# Patient Record
Sex: Female | Born: 1995 | Race: Black or African American | Hispanic: No | Marital: Single | Smoking: Never smoker
Health system: Southern US, Community
[De-identification: ages and names within clinical notes are randomized; demographics above are authoritative.]

---
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
## 2018-01-13 ENCOUNTER — Ambulatory Visit (HOSPITAL_COMMUNITY)
Admission: EM | Admit: 2018-01-13 | Discharge: 2018-01-13 | Disposition: A | Discharge status: Home / Self Care | Payer: Self-pay | Attending: Family Medicine | Admitting: Family Medicine

## 2018-01-13 ENCOUNTER — Other Ambulatory Visit: Payer: Self-pay

## 2018-01-13 ENCOUNTER — Encounter (HOSPITAL_COMMUNITY): Payer: Self-pay

## 2018-01-13 DIAGNOSIS — K648 Other hemorrhoids: Secondary | ICD-10-CM

## 2018-01-13 MED ORDER — HYDROCORTISONE 1 % EX CREA: TOPICAL_CREAM | CUTANEOUS | 0 refills | Status: DC

## 2018-01-13 NOTE — ED Provider Notes (Signed)
[facility] CARE CENTER   [id number] 01/13/18 Arrival Time: 1301  SUBJECTIVE:  Sara Bradford is a 22 y.o. female who presents with complaint of rectal pain that began gradually 1 week ago.  Reports an episode of straining prior to symptoms.  Localizes pain to rectum.  Describes pain as intermittent throbbing and itching in character.  Has tried OTC medications creams and wipes without relief.  Denies alleviating or aggravating factors.  Denies similar symptoms in the past.  Last BM yesterday. Blood in commode and with wiping, but states she is currently menstruating.    Denies fever, chills, appetite changes, weight changes, nausea, vomiting, chest pain, SOB, diarrhea, constipation, hematochezia, melena, dysuria, difficulty urinating, increased frequency or urgency, flank pain, loss of bowel or bladder function.  Patient's last menstrual period was 01/11/2018.  ROS: As per HPI.  History reviewed. No pertinent past medical history. History reviewed. No pertinent surgical history. No Known Allergies No current facility-administered medications on file prior to encounter.    No current outpatient medications on file prior to encounter.   Social History   Socioeconomic History  . Marital status: Single    Spouse name: Not on file  . Number of children: Not on file  . Years of education: Not on file  . Highest education level: Not on file  Occupational History  . Not on file  Social Needs  . Financial resource strain: Not on file  . Food insecurity:    Worry: Not on file    Inability: Not on file  . Transportation needs:    Medical: Not on file    Non-medical: Not on file  Tobacco Use  . Smoking status: Not on file  Substance and Sexual Activity  . Alcohol use: Not on file  . Drug use: Not on file  . Sexual activity: Not on file  Lifestyle  . Physical activity:    Days per week: Not on file    Minutes per session: Not on file  . Stress: Not on file  Relationships  .  Social connections:    Talks on phone: Not on file    Gets together: Not on file    Attends religious service: Not on file    Active member of club or organization: Not on file    Attends meetings of clubs or organizations: Not on file    Relationship status: Not on file  . Intimate partner violence:    Fear of current or ex partner: Not on file    Emotionally abused: Not on file    Physically abused: Not on file    Forced sexual activity: Not on file  Other Topics Concern  . Not on file  Social History Narrative  . Not on file   No family history on file.   OBJECTIVE:  Vitals:   01/13/18 1357  BP: 125/76  Pulse: 78  Resp: 18  Temp: 98.4 F (36.9 C)  SpO2: 100%    General appearance: AOx3 in no acute distress HEENT: NCAT.  Oropharynx clear.  Lungs: clear to auscultation bilaterally without adventitious breath sounds Heart: regular rate and rhythm.  Radial pulses 2+ symmetrical bilaterally Abdomen: soft, non-distended; normal active bowel sounds; non-tender to light and deep palpation;  no guarding Rectal: External: External hemorrhoid in the 6 o'clock position, mildly tender to palpation, no obvious anal fissues: Internal: No masses or obvious internal hemorrhoids appreciated, normal sphincter tone. Hemoccult negative Extremities: no edema; symmetrical with no gross deformities Skin: warm and dry  Neurologic: normal gait Psychological: alert and cooperative; normal mood and affect  Labs: No results found for this or any previous visit. Labs Reviewed - No data to display  Imaging: No results found.   ASSESSMENT & PLAN:  1. Other hemorrhoids     Meds ordered this encounter  Medications  . hydrocortisone cream 1 %    Sig: Apply to affected area 2 times daily    Dispense:  15 g    Refill:  0    Order Specific Question:   Supervising Provider    Answer:   Isa Rankin [[id number]]    Drink plenty of fluids Increase fiber in your diet Avoid straining or  heavy lifting activities Prescribed preparation H as needed for symptomatic relief Use wet wipes as needed Follow up with PCP if symptoms persists Return or go to the ER if you have any new or worsening symptoms   Reviewed expectations re: course of current medical issues. Questions answered. Outlined signs and symptoms indicating need for more acute intervention. Patient verbalized understanding. After Visit Summary given.   Rennis Harding, PA-C 01/13/18 [phone]

## 2018-01-13 NOTE — ED Triage Notes (Signed)
Pt presents with complaints of hemorrhoids x 1 week.

## 2018-01-13 NOTE — Discharge Instructions (Signed)
Drink plenty of fluids Increase fiber in your diet Avoid straining or heavy lifting activities Prescribed preparation H as needed for symptomatic relief Use wet wipes as needed Follow up with PCP if symptoms persists Return or go to the ER if you have any new or worsening symptoms

## 2019-08-04 ENCOUNTER — Other Ambulatory Visit: Payer: Self-pay

## 2019-08-04 ENCOUNTER — Inpatient Hospital Stay (HOSPITAL_BASED_OUTPATIENT_CLINIC_OR_DEPARTMENT_OTHER)
Admission: AD | Admit: 2019-08-04 | Discharge: 2019-08-04 | Disposition: A | Discharge status: Other Acute Inpatient Hospital | Payer: Medicaid Other | Attending: Obstetrics & Gynecology | Admitting: Obstetrics & Gynecology

## 2019-08-04 ENCOUNTER — Encounter (HOSPITAL_BASED_OUTPATIENT_CLINIC_OR_DEPARTMENT_OTHER): Payer: Self-pay | Admitting: Emergency Medicine

## 2019-08-04 ENCOUNTER — Inpatient Hospital Stay (HOSPITAL_COMMUNITY): Payer: Medicaid Other

## 2019-08-04 DIAGNOSIS — Z3A00 Weeks of gestation of pregnancy not specified: Secondary | ICD-10-CM | POA: Diagnosis not present

## 2019-08-04 DIAGNOSIS — O209 Hemorrhage in early pregnancy, unspecified: Secondary | ICD-10-CM

## 2019-08-04 DIAGNOSIS — O3680X0 Pregnancy with inconclusive fetal viability, not applicable or unspecified: Secondary | ICD-10-CM

## 2019-08-04 DIAGNOSIS — O200 Threatened abortion: Secondary | ICD-10-CM | POA: Insufficient documentation

## 2019-08-04 DIAGNOSIS — Z3A01 Less than 8 weeks gestation of pregnancy: Secondary | ICD-10-CM | POA: Diagnosis not present

## 2019-08-04 LAB — ABO/RH: ABO/RH(D): O POS

## 2019-08-04 LAB — URINALYSIS, ROUTINE W REFLEX MICROSCOPIC
Bilirubin Urine: NEGATIVE
Glucose, UA: NEGATIVE mg/dL
Hgb urine dipstick: NEGATIVE
Ketones, ur: 15 mg/dL — AB
Leukocytes,Ua: NEGATIVE
Nitrite: NEGATIVE
Protein, ur: NEGATIVE mg/dL
Specific Gravity, Urine: >1.03 — ABNORMAL HIGH (ref 1.005–1.030)
pH: 5.5 (ref 5.0–8.0)

## 2019-08-04 LAB — WET PREP, GENITAL
Sperm: NONE SEEN
Trich, Wet Prep: NONE SEEN
Yeast Wet Prep HPF POC: NONE SEEN

## 2019-08-04 LAB — PREGNANCY, URINE: Preg Test, Ur: POSITIVE — AB

## 2019-08-04 LAB — HIV ANTIBODY (ROUTINE TESTING W REFLEX): HIV Screen 4th Generation wRfx: NONREACTIVE

## 2019-08-04 LAB — HCG, QUANTITATIVE, PREGNANCY: hCG, Beta Chain, Quant, S: 687 m[IU]/mL — ABNORMAL HIGH (ref <5)

## 2019-08-04 MED ORDER — RHO D IMMUNE GLOBULIN 1500 UNIT/2ML IJ SOSY
300.0000 ug | PREFILLED_SYRINGE | Freq: Once | INTRAMUSCULAR | Status: DC
  Filled 2019-08-04: qty 2

## 2019-08-04 NOTE — ED Notes (Signed)
Pt to transfer to MAU for ultrasound.

## 2019-08-04 NOTE — ED Triage Notes (Signed)
Patient states that she has some spotting last week and then a brown d/c - the patient states that she took 4 pregnancy tests at home and they were positive. Denies any d/c at this time. She states that she wants to confirm the pregnancy and " want to know the next steps"

## 2019-08-04 NOTE — MAU Note (Addendum)
Transfer from [employer] [facility] for ultrasound. Pt reports vaginal bleeding and cramping last Sunday. Pt reports she is not currently having pain or bleeding.

## 2019-08-04 NOTE — Discharge Instructions (Addendum)
Return to care  °· If you have heavier bleeding that soaks through more that 2 pads per hour for an hour or more °· If you bleed so much that you feel like you might pass out or you do pass out °· If you have significant abdominal pain that is not improved with Tylenol  ° ° ° ° °Vaginal Bleeding During Pregnancy, First Trimester ° °A small amount of bleeding from the vagina (spotting) is relatively common during early pregnancy. It usually stops on its own. Various things may cause bleeding or spotting during early pregnancy. Some bleeding may be related to the pregnancy, and some may not. In many cases, the bleeding is normal and is not a problem. However, bleeding can also be a sign of something serious. Be sure to tell your health care provider about any vaginal bleeding right away. °Some possible causes of vaginal bleeding during the first trimester include: °· Infection or inflammation of the cervix. °· Growths (polyps) on the cervix. °· Miscarriage or threatened miscarriage. °· Pregnancy tissue developing outside of the uterus (ectopic pregnancy). °· A mass of tissue developing in the uterus due to an egg being fertilized incorrectly (molar pregnancy). °Follow these instructions at home: °Activity °· Follow instructions from your health care provider about limiting your activity. Ask what activities are safe for you. °· If needed, make plans for someone to help with your regular activities. °· Do not have sex or orgasms until your health care provider says that this is safe. °General instructions °· Take over-the-counter and prescription medicines only as told by your health care provider. °· Pay attention to any changes in your symptoms. °· Do not use tampons or douche. °· Write down how many pads you use each day, how often you change pads, and how soaked (saturated) they are. °· If you pass any tissue from your vagina, save the tissue so you can show it to your health care provider. °· Keep all follow-up  visits as told by your health care provider. This is important. °Contact a health care provider if: °· You have vaginal bleeding during any part of your pregnancy. °· You have cramps or labor pains. °· You have a fever. °Get help right away if: °· You have severe cramps in your back or abdomen. °· You pass large clots or a large amount of tissue from your vagina. °· Your bleeding increases. °· You feel light-headed or weak, or you faint. °· You have chills. °· You are leaking fluid or have a gush of fluid from your vagina. °Summary °· A small amount of bleeding (spotting) from the vagina is relatively common during early pregnancy. °· Various things may cause bleeding or spotting in early pregnancy. °· Be sure to tell your health care provider about any vaginal bleeding right away. °This information is not intended to replace advice given to you by your health care provider. Make sure you discuss any questions you have with your health care provider. °Document Released: 05/26/2005 Document Revised: 12/05/2018 Document Reviewed: 11/18/2016 °Elsevier Patient Education © 2020 Elsevier Inc. ° °

## 2019-08-04 NOTE — ED Provider Notes (Signed)
[facility] EMERGENCY DEPARTMENT Provider Note   CSN: [id number] Arrival date & time: 08/04/19  1719     History   Chief Complaint Chief Complaint  Patient presents with  . Vaginal Bleeding    HPI Sara Bradford is a 23 y.o. female.     The history is provided by the patient and medical records. No language interpreter was used.  Vaginal Bleeding  Sara Bradford is a 23 y.o. female who presents to the Emergency Department complaining of positive pregnancy test.  She has taken four home pregnancy tests and they were positive and she wants to know the next steps.  LMP was 11/5.  She had some light spotting two days ago, now resolved.  She has associated mild nausea.  Denies vaginal discharge.  No new sexual partners.  She has no prior medical hx.  No prior pregnancies.   History reviewed. No pertinent past medical history.  There are no active problems to display for this patient.   History reviewed. No pertinent surgical history.   OB History    Gravida  1   Para      Term      Preterm      AB      Living        SAB      TAB      Ectopic      Multiple      Live Births               Home Medications    Prior to Admission medications   Medication Sig Start Date End Date Taking? Authorizing Provider  hydrocortisone cream 1 % Apply to affected area 2 times daily 01/13/18   [place], [place], PA-C    Family History History reviewed. No pertinent family history.  Social History Social History   Tobacco Use  . Smoking status: Never Smoker  . Smokeless tobacco: Never Used  Substance Use Topics  . Alcohol use: Not Currently  . Drug use: Never     Allergies   Patient has no known allergies.   Review of Systems Review of Systems  Genitourinary: Positive for vaginal bleeding.  All other systems reviewed and are negative.    Physical Exam Updated Vital Signs BP 133/84 (BP Location: Right Arm)   Pulse 82   Temp 98.8 F  (37.1 C) (Oral)   Resp 16   Ht 5\' 7"  (1.702 m)   Wt 64.2 kg   LMP 07/05/2019   SpO2 100%   BMI 22.18 kg/m   Physical Exam Vitals signs and nursing note reviewed.  Constitutional:      Appearance: She is well-developed.  HENT:     Head: Normocephalic and atraumatic.  Cardiovascular:     Rate and Rhythm: Normal rate and regular rhythm.     Heart sounds: No murmur.  Pulmonary:     Effort: Pulmonary effort is normal. No respiratory distress.     Breath sounds: Normal breath sounds.  Abdominal:     Palpations: Abdomen is soft.     Tenderness: There is no abdominal tenderness. There is no guarding or rebound.  Genitourinary:    Comments: Scant vaginal discharge.  No CMT.  Os closed. Musculoskeletal:        General: No tenderness.  Skin:    General: Skin is warm and dry.  Neurological:     Mental Status: She is alert and oriented to person, place, and time.  Psychiatric:  Behavior: Behavior normal.      ED Treatments / Results  Labs (all labs ordered are listed, but only abnormal results are displayed) Labs Reviewed  WET PREP, GENITAL - Abnormal; Notable for the following components:      Result Value   Clue Cells Wet Prep HPF POC PRESENT (*)    WBC, Wet Prep HPF POC FEW (*)    All other components within normal limits  PREGNANCY, URINE - Abnormal; Notable for the following components:   Preg Test, Ur POSITIVE (*)    All other components within normal limits  URINALYSIS, ROUTINE W REFLEX MICROSCOPIC - Abnormal; Notable for the following components:   Specific Gravity, Urine >1.030 (*)    Ketones, ur 15 (*)    All other components within normal limits  HCG, QUANTITATIVE, PREGNANCY - Abnormal; Notable for the following components:   hCG, Beta Chain, Quant, S 687 (*)    All other components within normal limits  RPR  HIV ANTIBODY (ROUTINE TESTING W REFLEX)  ABO/RH  GC/CHLAMYDIA PROBE AMP ([place]) NOT AT [facility]    EKG None  Radiology No results found.   Procedures Procedures (including critical care time)  Medications Ordered in ED Medications - No data to display   Initial Impression / Assessment and Plan / ED Course  I have reviewed the triage vital signs and the nursing notes.  Pertinent labs & imaging results that were available during my care of the patient were reviewed by me and considered in my medical decision making (see chart for details).        Patient here for evaluation of positive pregnancy test at home, recent mild cramping and light vaginal bleeding. She is non-toxic appearing on evaluation, no active vaginal bleeding on bedside examination. Attempted bedside transabdominal US - empty bladder, unable to visualize IUP.  HCG is mildly elevated, pelvic ultrasound is not available at this time. Discussed with on-call OB/GYN at [facility] center at [facility], who agrees to accept the patient in transfer for formal pelvic ultrasound to rule out ectopic. Discussed with patient importance of further evaluation today she is in agreement with treatment plan. Final Clinical Impressions(s) / ED Diagnoses   Final diagnoses:  Threatened miscarriage    ED Discharge Orders    None       Quintella Reichert, MD 08/04/19 2126

## 2019-08-04 NOTE — MAU Provider Note (Addendum)
Patient Sara Bradford RevealG Marik is a 23 y.o. G1P0  at 6010w2d by sure LMP here with complaints of pink spotting on her toilet paper on Sunday and Monday (6 days ago). She denies abdominal pain, NV, abnormal vaginal discharge. This is her first pregnancy. She denies any other medical history. [facility].  History     CSN: [id number]  Arrival date and time: 08/04/19 1719   None     Chief Complaint  Patient presents with  . Vaginal Bleeding   Vaginal Bleeding The patient's primary symptoms include vaginal discharge. This is a new problem. The current episode started in the past 7 days. The problem occurs rarely. The problem has been resolved. The patient is experiencing no pain. Pertinent negatives include no abdominal pain, back pain, constipation, diarrhea, hematuria, urgency or vomiting. The vaginal discharge was bloody. The vaginal bleeding is spotting. She has not been passing clots. She has not been passing tissue.    OB History    Gravida  1   Para      Term      Preterm      AB      Living        SAB      TAB      Ectopic      Multiple      Live Births              History reviewed. No pertinent past medical history.  History reviewed. No pertinent surgical history.  History reviewed. No pertinent family history.  Social History   Tobacco Use  . Smoking status: Never Smoker  . Smokeless tobacco: Never Used  Substance Use Topics  . Alcohol use: Not Currently  . Drug use: Never    Allergies: No Known Allergies  Medications Prior to Admission  Medication Sig Dispense Refill Last Dose  . hydrocortisone cream 1 % Apply to affected area 2 times daily 15 g 0     Review of Systems  Constitutional: Negative.   HENT: Negative.   Respiratory: Negative.   Gastrointestinal: Negative.  Negative for abdominal pain, constipation, diarrhea and vomiting.  Genitourinary: Positive for vaginal  bleeding and vaginal discharge. Negative for hematuria and urgency.  Musculoskeletal: Negative.  Negative for back pain.  Hematological: Negative.   Psychiatric/Behavioral: Negative.    Physical Exam   Blood pressure 133/84, pulse 82, temperature 98.8 F (37.1 C), temperature source Oral, resp. rate 16, height 5\' 7"  (1.702 m), weight 64.2 kg, last menstrual period 07/05/2019, SpO2 100 %.  Physical Exam  Constitutional: She is oriented to person, place, and time. She appears well-developed and well-nourished.  HENT:  Head: Normocephalic.  Eyes: Pupils are equal, round, and reactive to light.  Neck: Normal range of motion.  Musculoskeletal: Normal range of motion.  Neurological: She is alert and oriented to person, place, and time. She has normal reflexes.    MAU Course  Procedures Results for orders placed or performed during the hospital encounter of 08/04/19 (from the past 24 hour(s))  Pregnancy, urine     Status: Abnormal   Collection Time: 08/04/19  5:45 PM  Result Value Ref Range   Preg Test, Ur POSITIVE (A) NEGATIVE  Urinalysis, Routine w reflex microscopic     Status: Abnormal   Collection Time: 08/04/19  5:45 PM  Result Value Ref Range   Color, Urine YELLOW YELLOW   APPearance CLEAR CLEAR  Specific Gravity, Urine >1.030 (H) 1.005 - 1.030   pH 5.5 5.0 - 8.0   Glucose, UA NEGATIVE NEGATIVE mg/dL   Hgb urine dipstick NEGATIVE NEGATIVE   Bilirubin Urine NEGATIVE NEGATIVE   Ketones, ur 15 (A) NEGATIVE mg/dL   Protein, ur NEGATIVE NEGATIVE mg/dL   Nitrite NEGATIVE NEGATIVE   Leukocytes,Ua NEGATIVE NEGATIVE  ABO/Rh     Status: None   Collection Time: 08/04/19  7:00 PM  Result Value Ref Range   ABO/RH(D) O POS    No rh immune globuloin      NOT A RH IMMUNE GLOBULIN CANDIDATE, PT RH POSITIVE Performed at [facility] [address], [place], [place] [address]   hCG, quantitative, pregnancy     Status: Abnormal   Collection Time: 08/04/19  7:00 PM  Result Value  Ref Range   hCG, Beta Chain, Quant, S 687 (H) <5 mIU/mL  Wet prep, genital     Status: Abnormal   Collection Time: 08/04/19  7:18 PM   Specimen: PATH Cytology Cervicovaginal Ancillary Only  Result Value Ref Range   Yeast Wet Prep HPF POC NONE SEEN NONE SEEN   Trich, Wet Prep NONE SEEN NONE SEEN   Clue Cells Wet Prep HPF POC PRESENT (A) NONE SEEN   WBC, Wet Prep HPF POC FEW (A) NONE SEEN   Sperm NONE SEEN    US Ob Less Than 14 Weeks With Ob Transvaginal  Result Date: 08/04/2019 CLINICAL DATA:  Initial evaluation for acute vaginal spotting, early pregnancy. EXAM: OBSTETRIC <14 WK [place] AND TRANSVAGINAL OB US TECHNIQUE: Both transabdominal and transvaginal ultrasound examinations were performed for complete evaluation of the gestation as well as the maternal uterus, adnexal regions, and pelvic cul-de-sac. Transvaginal technique was performed to assess early pregnancy. COMPARISON:  None available. FINDINGS: Intrauterine gestational sac: Probable small early gestational sac seen within the endometrial cavity. Yolk sac:  Not visualized. Embryo:  Not visualized. Cardiac Activity: Negative. Heart Rate: N/A  bpm MSD: 2.0 mm - too small the characterize Subchorionic hemorrhage:  None visualized. Maternal uterus/adnexae: Ovaries are normal in appearance bilaterally. Corpus luteal cyst noted within the left ovary with associated moderate volume free fluid. IMPRESSION: 1. Probable early intrauterine gestational sac, but no yolk sac, fetal pole, or cardiac activity yet visualized. Recommend follow-up quantitative B-HCG levels and follow-up US in 14 days to confirm and assess viability. This recommendation follows SRU consensus guidelines: Diagnostic Criteria for Nonviable Pregnancy Early in the First Trimester. [place] Med 2013; 621:3086-57. 2. Left ovarian corpus luteal cyst with associated moderate volume free fluid within the pelvis. Electronically Signed   By: Jeannine Boga M.D.   On: 08/04/2019 22:12     MDM -patient sent for ob transvaginal 2145 for ectopic rule out.    Labs from [facility]:  GC pending Wet prep normal HIV, RPR pending Beta was 687  Assessment and Plan   -Patient care endorsed to [facility], NP at 2157   [place] 08/04/2019, 9:53 PM    Ultrasound shows tiny empty IUGS. Can't exclude ectopic pregnancy at this point.  This vaginal bleeding could represent a normal pregnancy, spontaneous abortion, or even an ectopic pregnancy which can be life-threatening. Cultures were obtained to rule out pelvic infection.  Will bring patient back for repeat HCG  RH positive   A:  1. Pregnancy of unknown anatomic location   2. Vaginal bleeding in pregnancy, first trimester    P: Discharge home in stable condition Scheduled for  repeat HCG at [facility] on Tuesday Ectopic vs SAB precautions reviewed  Judeth Horn, NP

## 2019-08-05 LAB — RPR: RPR Ser Ql: NONREACTIVE

## 2019-08-06 LAB — GC/CHLAMYDIA PROBE AMP (~~LOC~~) NOT AT ARMC
Chlamydia: NEGATIVE
Neisseria Gonorrhea: NEGATIVE

## 2019-08-07 ENCOUNTER — Other Ambulatory Visit: Payer: Self-pay

## 2019-08-07 ENCOUNTER — Ambulatory Visit (INDEPENDENT_AMBULATORY_CARE_PROVIDER_SITE_OTHER): Payer: Self-pay

## 2019-08-07 DIAGNOSIS — O3680X0 Pregnancy with inconclusive fetal viability, not applicable or unspecified: Secondary | ICD-10-CM

## 2019-08-07 LAB — BETA HCG QUANT (REF LAB): hCG Quant: 1999 m[IU]/mL

## 2019-08-07 NOTE — Progress Notes (Signed)
Pt here today following visit to MAU on 08/04/19 for pregnancy of unknown location. Pt reports no pain or bleeding since that time. Stat beta HCG lab drawn. Explained to pt that I will contact her with results and plan of care. Ectopic precautions given and pt verbalized understanding.   Beta HCG result today is 1999. Result and hx reviewed with [place], DO who recommends pt have follow-up US in 2 weeks. [place] scheduled for 12/21 @ 0900.   Pt called and notified of HCG result and provider recommendation. Pt given [place] appt date, time, and location. Requested that pt arrive to the office at [place]. Reviewed ectopic precautions with pt. Pt verbalizes understanding and does not have any questions at this time.   Apolonio Schneiders RN 08/07/19

## 2019-08-07 NOTE — Progress Notes (Signed)
Chart reviewed - agree with RN documentation.   

## 2019-08-20 ENCOUNTER — Encounter: Payer: Self-pay | Admitting: Family Medicine

## 2019-08-20 ENCOUNTER — Other Ambulatory Visit: Payer: Self-pay

## 2019-08-20 ENCOUNTER — Ambulatory Visit (HOSPITAL_COMMUNITY)
Admission: RE | Admit: 2019-08-20 | Discharge: 2019-08-20 | Disposition: A | Discharge status: Home / Self Care | Payer: Medicaid Other | Source: Ambulatory Visit | Attending: Family Medicine | Admitting: Family Medicine

## 2019-08-20 ENCOUNTER — Ambulatory Visit (INDEPENDENT_AMBULATORY_CARE_PROVIDER_SITE_OTHER): Payer: Self-pay | Admitting: Lactation Services

## 2019-08-20 DIAGNOSIS — Z3401 Encounter for supervision of normal first pregnancy, first trimester: Secondary | ICD-10-CM

## 2019-08-20 DIAGNOSIS — O3680X0 Pregnancy with inconclusive fetal viability, not applicable or unspecified: Secondary | ICD-10-CM | POA: Diagnosis not present

## 2019-08-20 MED ORDER — PRENATAL PLUS 27-1 MG PO TABS: 1.0000 | ORAL_TABLET | Freq: Every day | ORAL | 11 refills | Status: AC

## 2019-08-20 NOTE — Progress Notes (Addendum)
Pt here for [place] results. [place] reviewed with Dr. Ilda Basset.   Pt reports LMP Nov 3. EDD 04/03/2020  Pt reports some nausea.   Prescription sent in for PNV to her Pharmacy.   Pt given list of OTC meds that she can take during pregnancy.   Pt informed of when to seek emergency care at the MAU during pregnancy.   Pt with no questions or concerns at this time. Pt to call as needed. Enc pt to sign up for MyChart.

## 2019-08-21 NOTE — Progress Notes (Signed)
Patient seen and assessed by nursing staff during this encounter. I have reviewed the chart and agree with the documentation and plan.  Chandra Asher, MD 08/21/2019 10:57 AM   

## 2019-08-31 NOTE — L&D Delivery Note (Addendum)
Patient: Sara Bradford MRN: [id number]  GBS status: Negative  Patient is a 24 y.o. now G1P1001 s/p NSVD at [redacted]w[redacted]d, who was admitted for IOL for gHTN. AROM 3h 85m prior to delivery with clear fluid.   Initial SVE on admission was closed/thick/-3. Patient received Cytotec x2 with progression to 4/90/-1. Pitocin was started at 1957 and AROM was performed at 0111 with clear fluid. Patient continued to progress with Pitocin titration and was ultimately 10/100/+1 at 0439.  Delivery Note After progressing to complete, patient began pushing. Head delivered ROA. No nuchal cord present. Shoulder and body delivered in usual fashion. Infant with spontaneous cry, placed on mother's abdomen, dried and bulb suctioned. Cord clamped x 2 after 1-minute delay, and cut by family member. Cord blood drawn. Placenta delivered spontaneously with gentle cord traction. Fundus firm with massage and Pitocin. Perineum inspected and found to have a 1st degree perineal laceration that was repaired with 3-0 vicryl with good hemostasis.  At 4:59 AM a viable female was delivered via Vaginal, Spontaneous (Presentation: Right Occiput Anterior).  APGAR: 8, 9; weight pending.   Placenta status: Spontaneous, Intact.  Cord: 3 vessels with the following complications: None.  Anesthesia: Epidural Episiotomy: None Lacerations: 1st degree;Perineal Suture Repair: 3.0 vicryl Est. Blood Loss (mL): 65  Mom to postpartum.  Baby to Couplet care / Skin to Skin.  Worthy Rancher, MD 03/21/2020, 5:30 AM  OB FELLOW DELIVERY ATTESTATION  I was gloved and present for the delivery in its entirety, and I agree with the above resident's note.    Jerilynn Birkenhead, MD [facility] Fellow, [facility] for [employer], [facility]

## 2019-09-05 ENCOUNTER — Other Ambulatory Visit: Payer: Self-pay

## 2019-09-05 ENCOUNTER — Ambulatory Visit (INDEPENDENT_AMBULATORY_CARE_PROVIDER_SITE_OTHER): Payer: Medicaid Other | Admitting: *Deleted

## 2019-09-05 DIAGNOSIS — Z3490 Encounter for supervision of normal pregnancy, unspecified, unspecified trimester: Secondary | ICD-10-CM

## 2019-09-05 HISTORY — DX: Encounter for supervision of normal pregnancy, unspecified, unspecified trimester: Z34.90

## 2019-09-05 MED ORDER — BLOOD PRESSURE KIT DEVI
1.0000 | 0 refills | Status: AC | PRN
Start: 2019-09-05 — End: ?

## 2019-09-05 NOTE — Patient Instructions (Signed)

## 2019-09-05 NOTE — Progress Notes (Signed)
I connected with  Sara Bradford on 09/05/19 at  1:30 PM EST by telephone and verified that I am speaking with the correct person using two identifiers.   I discussed the limitations, risks, security and privacy concerns of performing an evaluation and management service by telephone and the availability of in person appointments. I also discussed with the patient that there may be a patient responsible charge related to this service. The patient expressed understanding and agreed to proceed.  Explained I am completing her New OB Intake today. We discussed Her EDD and that it is based on  sure LMP . I reviewed her allergies, meds, OB History, Medical /Surgical history, and appropriate screenings. I explained I will send her the Babyscripts app- app sent to her while on phone.  I explained we will send a blood pressure cuff to [facility] that will [facility] that prescription and they  will call her to verify her information. I asked her to bring the blood pressure cuff with her to her first ob appointment so we can show her how to use it. Explained  then we will have her take her blood pressure weekly and enter into the app. Explained she will have some visits in office and some virtually. She already has MyChart and I assisted her with downloading the app. I reviewed her new ob  appointment date/ time with her , our location and to wear mask, no visitors.  I explained she will have a pelvic exam, ob bloodwork, hemoglobin a1C, cbg , genetic testing if desired,- she is undecided if she wants a panorama,  pap if needed. I scheduled an [place] at 19 weeks and gave her the appointment. She voices understanding.  Malek Skog,RN 09/05/2019  1:25 PM

## 2019-09-05 NOTE — Progress Notes (Signed)
Patient seen and assessed by nursing staff during this encounter. I have reviewed the chart and agree with the documentation and plan.  Vonzella Nipple, PA-C 09/05/2019 2:24 PM

## 2019-09-19 ENCOUNTER — Other Ambulatory Visit (HOSPITAL_COMMUNITY)
Admission: RE | Admit: 2019-09-19 | Discharge: 2019-09-19 | Disposition: A | Discharge status: Home / Self Care | Payer: Medicaid Other | Source: Ambulatory Visit | Attending: Medical | Admitting: Medical

## 2019-09-19 ENCOUNTER — Ambulatory Visit (INDEPENDENT_AMBULATORY_CARE_PROVIDER_SITE_OTHER): Payer: Medicaid Other | Admitting: Medical

## 2019-09-19 ENCOUNTER — Other Ambulatory Visit: Payer: Self-pay

## 2019-09-19 VITALS — BP 123/79 | HR 86 | Wt 143.0 lb

## 2019-09-19 DIAGNOSIS — Z3491 Encounter for supervision of normal pregnancy, unspecified, first trimester: Secondary | ICD-10-CM | POA: Diagnosis not present

## 2019-09-19 DIAGNOSIS — Z23 Encounter for immunization: Secondary | ICD-10-CM | POA: Diagnosis not present

## 2019-09-19 DIAGNOSIS — Z3481 Encounter for supervision of other normal pregnancy, first trimester: Secondary | ICD-10-CM | POA: Diagnosis not present

## 2019-09-19 DIAGNOSIS — Z3490 Encounter for supervision of normal pregnancy, unspecified, unspecified trimester: Secondary | ICD-10-CM | POA: Diagnosis not present

## 2019-09-19 DIAGNOSIS — B9689 Other specified bacterial agents as the cause of diseases classified elsewhere: Secondary | ICD-10-CM

## 2019-09-19 DIAGNOSIS — Z3A10 10 weeks gestation of pregnancy: Secondary | ICD-10-CM | POA: Diagnosis not present

## 2019-09-19 NOTE — Progress Notes (Signed)
   PRENATAL VISIT NOTE  Subjective:  Sara Bradford is a 24 y.o. G1P0 at [redacted]w[redacted]d being seen today for her first prenatal visit for this pregnancy.  She is currently monitored for the following issues for this low-risk pregnancy and has Supervision of low-risk pregnancy on their problem list.  Patient reports no complaints.  Contractions: Not present. Vag. Bleeding: None.  Movement: Absent. Denies leaking of fluid.   She is unsure about MOF. Desires contraception, unsure of type yet. Information provided.   The following portions of the patient's history were reviewed and updated as appropriate: allergies, current medications, past family history, past medical history, past social history, past surgical history and problem list.   Objective:   Vitals:   09/19/19 0845  BP: 123/79  Pulse: 86  Weight: 143 lb (64.9 kg)    Fetal Status: Fetal Heart Rate (bpm): 172   Movement: Absent     General:  Alert, oriented and cooperative. Patient is in no acute distress.  Skin: Skin is warm and dry. No rash noted.   Cardiovascular: Normal heart rate and rhythm noted  Respiratory: Normal respiratory effort, no problems with respiration noted. Clear to auscultation.   Abdomen: Soft, gravid, appropriate for gestational age. Normal bowel sounds. Non-tender. Pain/Pressure: Absent     Pelvic: Cervical exam performed Dilation: Closed Effacement (%): Thick   Normal cervical contour, no lesions, no bleeding following pap, normal discharge  Extremities: Normal range of motion.  Edema: None  Mental Status: Normal mood and affect. Normal behavior. Normal judgment and thought content.   Assessment and Plan:  Pregnancy: G1P0 at [redacted]w[redacted]d 1. Encounter for supervision of low-risk pregnancy in first trimester - Flu Vaccine QUAD 36+ mos IM - Culture, OB Urine - Obstetric Panel, Including HIV - Genetic Screening - Panorama and Horizon  - Hemoglobin A1c - Cytology - PAP( [facility]) with GC/Chlamydia  - Discussed  Baby Rx optimization schedule and cadence of OB visits associated with that  - Discussed reasons to present to MAU vs ED - Discussed nature of our practice with multiple providers including students and residents  - Anatomy [place] scheduled 3/24, patient will have AFP drawn after that visit - BP cuff ordered  Preterm labor/ first trimester warning symptoms and general obstetric precautions including but not limited to vaginal bleeding, contractions, leaking of fluid and fetal movement were reviewed in detail with the patient. Please refer to After Visit Summary for other counseling recommendations.   Return in about 8 weeks (around 11/14/2019) for LOB, Virtual.  Future Appointments  Date Time Provider Department Center  11/21/2019  8:00 AM WH-MFC [place] 3 WH-MFCUS M[facility]-US    Vonzella Nipple, PA-C

## 2019-09-19 NOTE — Patient Instructions (Addendum)
Contraception Choices Contraception, also called birth control, means things to use or ways to try not to get pregnant. Hormonal birth control This kind of birth control uses hormones. Here are some types of hormonal birth control:  A tube that is put under skin of the arm (implant). The tube can stay in for as long as 3 years.  Shots to get every 3 months (injections).  Pills to take every day (birth control pills).  A patch to change 1 time each week for 3 weeks (birth control patch). After that, the patch is taken off for 1 week.  A ring to put in the vagina. The ring is left in for 3 weeks. Then it is taken out of the vagina for 1 week. Then a new ring is put in.  Pills to take after unprotected sex (emergency birth control pills). Barrier birth control Here are some types of barrier birth control:  A thin covering that is put on the penis before sex (female condom). The covering is thrown away after sex.  A soft, loose covering that is put in the vagina before sex (female condom). The covering is thrown away after sex.  A rubber bowl that sits over the cervix (diaphragm). The bowl must be made for you. The bowl is put into the vagina before sex. The bowl is left in for 6-8 hours after sex. It is taken out within 24 hours.  A small, soft cup that fits over the cervix (cervical cap). The cup must be made for you. The cup can be left in for 6-8 hours after sex. It is taken out within 48 hours.  A sponge that is put into the vagina before sex. It must be left in for at least 6 hours after sex. It must be taken out within 30 hours. Then it is thrown away.  A chemical that kills or stops sperm from getting into the uterus (spermicide). It may be a pill, cream, jelly, or foam to put in the vagina. The chemical should be used at least 10-15 minutes before sex. IUD (intrauterine) birth control An IUD is a small, T-shaped piece of plastic. It is put inside the uterus. There are two  kinds:  Hormone IUD. This kind can stay in for 3-5 years.  Copper IUD. This kind can stay in for 10 years. Permanent birth control Here are some types of permanent birth control:  Surgery to block the fallopian tubes.  Having an insert put into each fallopian tube.  Surgery to tie off the tubes that carry sperm (vasectomy). Natural planning birth control Here are some types of natural planning birth control:  Not having sex on the days the woman could get pregnant.  Using a calendar: ? To keep track of the length of each period. ? To find out what days pregnancy can happen. ? To plan to not have sex on days when pregnancy can happen.  Watching for symptoms of ovulation and not having sex during ovulation. One way the woman can check for ovulation is to check her temperature.  Waiting to have sex until after ovulation. Summary  Contraception, also called birth control, means things to use or ways to try not to get pregnant.  Hormonal methods of birth control include implants, injections, pills, patches, vaginal rings, and emergency birth control pills.  Barrier methods of birth control can include female condoms, female condoms, diaphragms, cervical caps, sponges, and spermicides.  There are two types of IUD (intrauterine device) birth control.  An IUD can be put in a woman's uterus to prevent pregnancy for 3-5 years.  Permanent sterilization can be done through a procedure for males, females, or both.  Natural planning methods involve not having sex on the days when the woman could get pregnant. This information is not intended to replace advice given to you by your health care provider. Make sure you discuss any questions you have with your health care provider. Document Revised: 12/06/2018 Document Reviewed: 08/26/2016 Elsevier Patient Education  [place] Education Options: [facility] Department Classes:  Childbirth education classes can  help you get ready for a positive parenting experience. You can also meet other expectant parents and get free stuff for your baby. Each class runs for five weeks on the same night and costs $45 for the mother-to-be and her support person. Medicaid covers the cost if you are eligible. Call [phone] to register. [facility] Childbirth Education:  [phone] or [phone] or [email or web address]  Baby & Me Class: Discuss newborn & infant parenting and family adjustment issues with other new mothers in a relaxed environment. Each week brings a new speaker or baby-centered activity. We encourage new mothers to join [place] every Thursday at 11:00am. Babies birth until crawling. No registration or fee. Daddy [employer]: This course offers Dads-to-be the tools and knowledge needed to feel confident on their journey to becoming new fathers. Experienced dads, who have been trained as coaches, teach dads-to-be how to hold, comfort, diaper, swaddle and play with their infant while being able to support the new mom as well. A class for men taught by men. $25/dad Big Brother/Big Sister: Let your children share in the joy of a new brother or sister in this special class designed just for them. Class includes discussion about how families care for babies: swaddling, holding, diapering, safety as well as how they can be helpful in their new role. This class is designed for children ages 43 to 60, but any age is welcome. Please register each child individually. $5/child  Mom Talk: This mom-led group offers support and connection to mothers as they journey through the adjustments and struggles of that sometimes overwhelming first year after the birth of a child. Tuesdays at 10:00am and Thursdays at 6:00pm. Babies welcome. No registration or fee. Breastfeeding Support Group: This group is a mother-to-mother support circle where moms have the opportunity to share their breastfeeding experiences. A Lactation  Consultant is present for questions and concerns. Meets each Tuesday at 11:00am. No fee or registration. Breastfeeding Your Baby: Learn what to expect in the first days of breastfeeding your newborn.  This class will help you feel more confident with the skills needed to begin your breastfeeding experience. Many new mothers are concerned about breastfeeding after leaving the hospital. This class will also address the most common fears and challenges about breastfeeding during the first few weeks, months and beyond. (call for fee) Comfort Techniques and Tour: This 2 hour interactive class will provide you the opportunity to learn & practice hands-on techniques that can help relieve some of the discomfort of labor and encourage your baby to rotate toward the best position for birth. You and your partner will be able to try a variety of labor positions with birth balls and rebozos as well as practice breathing, relaxation, and visualization techniques. A tour of the [facility] is included with this class. $20 per registrant and support person Childbirth Class- Weekend Option: This class is a Weekend  version of our Birth & Baby series. It is designed for parents who have a difficult time fitting several weeks of classes into their schedule. It covers the care of your newborn and the basics of labor and childbirth. It also includes a [place] [facility] and lunch. The class is held two consecutive days: beginning on Friday evening from 6:30 - 8:30 p.m. and the next day, Saturday from 9 a.m. - 4 p.m. (call for fee) Doren Custard Class: Interested in a waterbirth?  This informational class will help you discover whether waterbirth is the right fit for you. Education about waterbirth itself, supplies you would need and how to assemble your support team is what you can expect from this class. Some obstetrical practices require this class in order to pursue a waterbirth.  (Not all obstetrical practices offer waterbirth-check with your healthcare provider.) Register only the expectant mom, but you are encouraged to bring your partner to class! Required if planning waterbirth, no fee. Infant/Child CPR: Parents, grandparents, babysitters, and friends learn Cardio-Pulmonary Resuscitation skills for infants and children. You will also learn how to treat both conscious and unconscious choking in infants and children. This Family & Friends program does not offer certification. Register each participant individually to ensure that enough mannequins are available. (Call for fee) Grandparent Love: Expecting a grandbaby? This class is for you! Learn about the latest infant care and safety recommendations and ways to support your own child as he or she transitions into the parenting role. Taught by Registered Nurses who are childbirth instructors, but most importantly...they are grandmothers too! $10/person. Childbirth Class- Natural Childbirth: This series of 5 weekly classes is for expectant parents who want to learn and practice natural methods of coping with the process of labor and childbirth. Relaxation, breathing, massage, visualization, role of the partner, and helpful positioning are highlighted. Participants learn how to be confident in their body's ability to give birth. This class will empower and help parents make informed decisions about their own care. Includes discussion that will help new parents transition into the immediate postpartum period. [facility] is included. We suggest taking this class between 25-32 weeks, but it's only a recommendation. $75 per registrant and one support person or $30 Medicaid. Childbirth Class- 3 week Series: This option of 3 weekly classes helps you and your labor partner prepare for childbirth. Newborn care, labor & birth, cesarean birth, pain management, and comfort techniques are discussed and a [place] [facility] is included. The class meets at the same time, on the same day of the week for 3 consecutive weeks beginning with the starting date you choose. $60 for registrant and one support person.  Marvelous Multiples: Expecting twins, triplets, or more? This class covers the differences in labor, birth, parenting, and breastfeeding issues that face multiples' parents. NICU tour is included. Led by a Certified Childbirth Educator who is the mother of twins. No fee. Caring for Baby: This class is for expectant and adoptive parents who want to learn and practice the most up-to-date newborn care for their babies. Focus is on birth through the first six weeks of life. Topics include feeding, bathing, diapering, crying, umbilical cord care, circumcision care and safe sleep. Parents learn to recognize symptoms of illness and when to call the pediatrician. Register only the mom-to-be and your partner or support person can plan to come with you! $10 per registrant and support person Childbirth Class- online option:  This online class offers you the freedom to complete a Birth and Baby series in the comfort of your own home. The flexibility of this option allows you to review sections at your own pace, at times convenient to you and your support people. It includes additional video information, animations, quizzes, and extended activities. Get organized with helpful eClass tools, checklists, and trackers. Once you register online for the class, you will receive an email within a few days to accept the invitation and begin the class when the time is right for you. The content will be available to you for 60 days. $60 for 60 days of online access for you and your support people.  Local Doulas: Natural Baby Doulas [email or web address] Tel: [phone] [email or web address] [employer] [phone] [email or web address] The Labor  Hassell Halim  (also do waterbirth tub rental) [phone] [email or web address] Triad Birth Doula [phone] [email or web address] NotebookDistributors.fi Sacred Rhythms  [phone] https://[email or web address]-[email or web address]/ [employer] (PADA) [email or web address]/ [place] Birth and Baby  [email or web address]/ Considering Waterbirth? Guide for patients at [facility] [employer]  Why consider waterbirth?  . Gentle birth for babies . Less pain medicine used in labor . May allow for passive descent/less pushing . May reduce perineal tears  . More mobility and instinctive maternal position changes . Increased maternal relaxation . Reduced blood pressure in labor  Is waterbirth safe? What are the risks of infection, drowning or other complications?  . Infection: o Very low risk (3.7 % for tub vs 4.8% for bed) o 7 in 8000 waterbirths with documented infection o Poorly cleaned equipment most common cause o Slightly lower group B strep transmission rate  . Drowning o Maternal:  - Very low risk   - Related to seizures or fainting o Newborn:  - Very low risk. No evidence of increased risk of respiratory problems in multiple large studies - Physiological protection from breathing under water - Avoid underwater birth if there are any fetal complications - Once baby's head is out of the water, keep it out.  . Birth complication o Some reports of cord trauma, but risk decreased by bringing baby to surface gradually o No evidence of increased risk of shoulder dystocia. Mothers can usually change positions faster in water than in a bed, possibly aiding the maneuvers to free the shoulder.   You must attend a Doren Custard class at [facility]  3rd Wednesday of every month from 7-9pm  [employer] by calling [phone] or online at [email or web address]  Bring [place]  the certificate from the class to your prenatal appointment  Meet with a midwife at 36 weeks to see if you can still plan a waterbirth and to sign the consent.   Purchase or rent the following supplies:   Water Birth Pool (Birth Pool in a Box or Road Runner for instance)  (Tubs start ~$125)  Single-use disposable tub liner designed for your brand of tub  New garden hose labeled "lead-free", "suitable for drinking water",  Electric drain pump to remove water (We recommend 792 gallon per hour or greater pump.)   Separate garden hose to remove the dirty water  Fish net  Bathing suit top (optional)  Long-handled mirror (optional)  Places to purchase or rent supplies  GotWebTools.is for tub purchases and supplies  Waterbirthsolutions.com for tub purchases and supplies  The Labor Ladies (www.[email or web address]) $275 for tub rental/set-up & take down/kit   [employer] (http://www.fleming.com/.htm) Information regarding doulas (labor support) who provide pool rentals  Our practice has a Heritage manager  in a Box tub at the hospital that you may borrow on a first-come-first-served basis. It is your responsibility to to set up, clean and break down the tub. We cannot guarantee the availability of this tub in advance. You are responsible for bringing all accessories listed above. If you do not have all necessary supplies you cannot have a waterbirth.    Things that would prevent you from having a waterbirth:  Premature, <37wks  Previous cesarean birth  Presence of thick meconium-stained fluid  Multiple gestation (Twins, triplets, etc.)  Uncontrolled diabetes or gestational diabetes requiring medication  Hypertension requiring medication or diagnosis of pre-eclampsia  Heavy vaginal bleeding  Non-reassuring fetal heart rate  Active infection (MRSA, etc.). Group B Strep is NOT a contraindication for  waterbirth.  If your labor has to be induced and  induction method requires continuous  monitoring of the baby's heart rate  Other risks/issues identified by your obstetrical provider  Please remember that birth is unpredictable. Under certain unforeseeable circumstances your provider may advise against giving birth in the tub. These decisions will be made on a case-by-case basis and with the safety of you and your baby as our highest priority.  Safe Medications in Pregnancy   Acne:  Benzoyl Peroxide  Salicylic Acid   Backache/Headache:  Tylenol: 2 regular strength every 4 hours OR        2 Extra strength every 6 hours   Colds/Coughs/Allergies:  Benadryl (alcohol free) 25 mg every 6 hours as needed  Breath right strips  Claritin  Cepacol throat lozenges  Chloraseptic throat spray  Cold-Eeze- up to three times per day  Cough drops, alcohol free  Flonase (by prescription only)  Guaifenesin  Mucinex  Robitussin DM (plain only, alcohol free)  Saline nasal spray/drops  Sudafed (pseudoephedrine) & Actifed * use only after [redacted] weeks gestation and if you do not have high blood pressure  Tylenol  Vicks Vaporub  Zinc lozenges  Zyrtec   Constipation:  Colace  Ducolax suppositories  Fleet enema  Glycerin suppositories  Metamucil  Milk of magnesia  Miralax  Senokot  Smooth move tea   Diarrhea:  Kaopectate  Imodium A-D   *NO pepto Bismol   Hemorrhoids:  Anusol  Anusol HC  Preparation H  Tucks   Indigestion:  Tums  Maalox  Mylanta  Zantac  Pepcid   Insomnia:  Benadryl (alcohol free) 57m every 6 hours as needed  Tylenol PM  Unisom, no Gelcaps   Leg Cramps:  Tums  MagGel   Nausea/Vomiting:  Bonine  Dramamine  Emetrol  Ginger extract  Sea bands  Meclizine  Nausea medication to take during pregnancy:  Unisom (doxylamine succinate 25 mg tablets) Take one tablet daily at bedtime. If symptoms are not adequately controlled, the dose can be increased to a maximum recommended dose of two tablets daily  (1/2 tablet in the morning, 1/2 tablet mid-afternoon and one at bedtime).  Vitamin B6 1017mtablets. Take one tablet twice a day (up to 200 mg per day).   Skin Rashes:  Aveeno products  Benadryl cream or 2582mvery 6 hours as needed  Calamine Lotion  1% cortisone cream   Yeast infection:  Gyne-lotrimin 7  Monistat 7    **If taking multiple medications, please check labels to avoid duplicating the same active ingredients  **take medication as directed on the label  ** Do not exceed 4000 mg of tylenol in 24 hours  **Do not take medications that contain aspirin or ibuprofen

## 2019-09-20 ENCOUNTER — Encounter: Payer: Self-pay | Admitting: *Deleted

## 2019-09-20 LAB — OBSTETRIC PANEL, INCLUDING HIV
Antibody Screen: NEGATIVE
Basophils Absolute: 0 10*3/uL (ref 0.0–0.2)
Basos: 0 %
EOS (ABSOLUTE): 0.1 10*3/uL (ref 0.0–0.4)
Eos: 1 %
HIV Screen 4th Generation wRfx: NONREACTIVE
Hematocrit: 36.1 % (ref 34.0–46.6)
Hemoglobin: 12.1 g/dL (ref 11.1–15.9)
Hepatitis B Surface Ag: NEGATIVE
Immature Grans (Abs): 0.1 10*3/uL (ref 0.0–0.1)
Immature Granulocytes: 1 %
Lymphocytes Absolute: 1.3 10*3/uL (ref 0.7–3.1)
Lymphs: 17 %
MCH: 29.7 pg (ref 26.6–33.0)
MCHC: 33.5 g/dL (ref 31.5–35.7)
MCV: 89 fL (ref 79–97)
Monocytes Absolute: 0.6 10*3/uL (ref 0.1–0.9)
Monocytes: 8 %
Neutrophils Absolute: 5.5 10*3/uL (ref 1.4–7.0)
Neutrophils: 73 %
Platelets: 334 10*3/uL (ref 150–450)
RBC: 4.08 x10E6/uL (ref 3.77–5.28)
RDW: 12.4 % (ref 11.7–15.4)
RPR Ser Ql: NONREACTIVE
Rh Factor: POSITIVE
Rubella Antibodies, IGG: 1.17 index (ref >0.99)
WBC: 7.6 10*3/uL (ref 3.4–10.8)

## 2019-09-20 LAB — CYTOLOGY - PAP
Adequacy: ABSENT
Diagnosis: NEGATIVE
Diagnosis: REACTIVE

## 2019-09-20 LAB — HEMOGLOBIN A1C
Est. average glucose Bld gHb Est-mCnc: 97 mg/dL
Hgb A1c MFr Bld: 5 % (ref 4.8–5.6)

## 2019-09-21 LAB — CULTURE, OB URINE

## 2019-09-21 LAB — URINE CULTURE, OB REFLEX

## 2019-09-21 MED ORDER — METRONIDAZOLE 500 MG PO TABS: 500.0000 mg | ORAL_TABLET | Freq: Two times a day (BID) | ORAL | 0 refills | Status: DC

## 2019-09-21 NOTE — Addendum Note (Signed)
Addended by: Marny Lowenstein on: 09/21/2019 02:15 PM   Modules accepted: Orders

## 2019-10-02 ENCOUNTER — Encounter: Payer: Self-pay | Admitting: *Deleted

## 2019-10-04 ENCOUNTER — Telehealth: Payer: Self-pay

## 2019-10-04 NOTE — Telephone Encounter (Signed)
Received notficaiton from Babyscripts that pt reported a BP of 149/93 with no sx's.  Called pt and LM that I am calling in regards to the value that you placed into the Babyscripts app if she could please return the call or respond to your MyChart message.    Addison Naegeli, RN

## 2019-10-08 NOTE — Telephone Encounter (Addendum)
Called pt and informed pt that I wanted to f/u with her about her BP that reported on 10/03/19 in Babyscripts.  Pt states that she is not at home to be able to take her BP.  I advised pt that once she gets home before she is relaxed to take her BP.  Pt advised to respond with to the MyChart message that I send with BP value and I will f/u with her tomorrow.  Pt verbalized understanding.   Addison Naegeli, RN 10/08/19

## 2019-10-09 NOTE — Telephone Encounter (Addendum)
Received Mychart message from pt letting us know that her BP retake was 123/90.  Pt denies any sx's HTN.  Notified Dr. Jolayne Panther pt's BP values.  Dr. Jolayne Panther - no new orders at this time.  Provider recommended that she continues to monitor for sx's elevated BP and placing BP values in Babyscripts.  Pt verbalized understanding.   Addison Naegeli, RN 10/09/19

## 2019-11-15 ENCOUNTER — Telehealth (INDEPENDENT_AMBULATORY_CARE_PROVIDER_SITE_OTHER): Payer: Medicaid Other | Admitting: Certified Nurse Midwife

## 2019-11-15 DIAGNOSIS — Z3491 Encounter for supervision of normal pregnancy, unspecified, first trimester: Secondary | ICD-10-CM

## 2019-11-15 NOTE — Progress Notes (Signed)
I connected with  Charlesetta Garibaldi on 11/15/19 at 0914 by telephone and verified that I am speaking with the correct person using two identifiers.   I discussed the limitations, risks, security and privacy concerns of performing an evaluation and management service by telephone and the availability of in person appointments. I also discussed with the patient that there may be a patient responsible charge related to this service. The patient expressed understanding and agreed to proceed.  Marjo Bicker, RN 11/15/2019  9:14 AM

## 2019-11-15 NOTE — Patient Instructions (Signed)

## 2019-11-15 NOTE — Progress Notes (Signed)
   OBSTETRICS PRENATAL VIRTUAL VISIT ENCOUNTER NOTE  Provider location: Center for [facility] [place]   I connected with Sara Bradford on 11/15/19 at  9:15 AM EDT by MyChart Video Encounter at home and verified that I am speaking with the correct person using two identifiers.   I discussed the limitations, risks, security and privacy concerns of performing an evaluation and management service virtually and the availability of in person appointments. I also discussed with the patient that there may be a patient responsible charge related to this service. The patient expressed understanding and agreed to proceed. Subjective:  Sara Bradford is a 24 y.o. G1P0 at [redacted]w[redacted]d being seen today for ongoing prenatal care.  She is currently monitored for the following issues for this low-risk pregnancy and has Supervision of low-risk pregnancy on their problem list.  Patient reports insomnia, trouble staying asleep.  Contractions: Not present. Vag. Bleeding: None.  Movement: Present. Denies any leaking of fluid.   The following portions of the patient's history were reviewed and updated as appropriate: allergies, current medications, past family history, past medical history, past social history, past surgical history and problem list.   Objective:   Vitals:   11/15/19 0916  BP: 128/89  Pulse: 86    Fetal Status:     Movement: Present     General:  Alert, oriented and cooperative. Patient is in no acute distress.  Respiratory: Normal respiratory effort, no problems with respiration noted  Mental Status: Normal mood and affect. Normal behavior. Normal judgment and thought content.  Rest of physical exam deferred due to type of encounter  Imaging: No results found.  Assessment and Plan:  Pregnancy: G1P0 at [redacted]w[redacted]d 1. Encounter for supervision of low-risk pregnancy in first trimester - anatomy US scheduled  2. [place] - discussed sleep hygeine - [employer] tab  Preterm labor symptoms  and general obstetric precautions including but not limited to vaginal bleeding, contractions, leaking of fluid and fetal movement were reviewed in detail with the patient. I discussed the assessment and treatment plan with the patient. The patient was provided an opportunity to ask questions and all were answered. The patient agreed with the plan and demonstrated an understanding of the instructions. The patient was advised to call back or seek an in-person office evaluation/go to MAU at [facility] for any urgent or concerning symptoms. Please refer to After Visit Summary for other counseling recommendations.   I provided 8 minutes of face-to-face time during this encounter.  Return in about 5 weeks (around 12/20/2019).  Future Appointments  Date Time Provider Department Center  11/21/2019  8:00 AM WH-MFC [place] 3 WH-MFCUS MFC-US  11/21/2019  9:30 AM WOC-WOCA LAB WOC-WOCA WOC    Kadian Barcellos Denyse Amass, CNM Center for [employer], [facility]

## 2019-11-21 ENCOUNTER — Other Ambulatory Visit: Payer: Self-pay

## 2019-11-21 ENCOUNTER — Ambulatory Visit (HOSPITAL_COMMUNITY)
Admission: RE | Admit: 2019-11-21 | Discharge: 2019-11-21 | Disposition: A | Discharge status: Home / Self Care | Payer: Medicaid Other | Source: Ambulatory Visit | Attending: Obstetrics and Gynecology | Admitting: Obstetrics and Gynecology

## 2019-11-21 ENCOUNTER — Other Ambulatory Visit: Payer: Medicaid Other

## 2019-11-21 DIAGNOSIS — Z3490 Encounter for supervision of normal pregnancy, unspecified, unspecified trimester: Secondary | ICD-10-CM | POA: Diagnosis not present

## 2019-11-21 DIAGNOSIS — Z363 Encounter for antenatal screening for malformations: Secondary | ICD-10-CM | POA: Diagnosis not present

## 2019-11-21 DIAGNOSIS — Z3A19 19 weeks gestation of pregnancy: Secondary | ICD-10-CM

## 2019-11-21 DIAGNOSIS — Z3491 Encounter for supervision of normal pregnancy, unspecified, first trimester: Secondary | ICD-10-CM

## 2019-11-23 LAB — AFP, SERUM, OPEN SPINA BIFIDA
AFP MoM: 0.58
AFP Value: 31.6 ng/mL
Gest. Age on Collection Date: 19 weeks
Maternal Age At EDD: 24.3 yr
OSBR Risk 1 IN: 10000
Test Results:: NEGATIVE
Weight: 156 [lb_av]

## 2019-12-20 ENCOUNTER — Telehealth (INDEPENDENT_AMBULATORY_CARE_PROVIDER_SITE_OTHER): Payer: Medicaid Other | Admitting: Obstetrics and Gynecology

## 2019-12-20 DIAGNOSIS — Z3492 Encounter for supervision of normal pregnancy, unspecified, second trimester: Secondary | ICD-10-CM | POA: Diagnosis not present

## 2019-12-20 DIAGNOSIS — Z3A24 24 weeks gestation of pregnancy: Secondary | ICD-10-CM

## 2019-12-20 NOTE — Progress Notes (Signed)
I connected with  Charlesetta Garibaldi on 12/20/19 at  9:15 AM EDT by telephone and verified that I am speaking with the correct person using two identifiers.   I discussed the limitations, risks, security and privacy concerns of performing an evaluation and management service by telephone and the availability of in person appointments. I also discussed with the patient that there may be a patient responsible charge related to this service. The patient expressed understanding and agreed to proceed.  Janene Madeira Chara Marquard, CMA 12/20/2019  9:03 AM

## 2019-12-20 NOTE — Patient Instructions (Signed)
[email or web address] 

## 2019-12-20 NOTE — Progress Notes (Signed)
   TELEHEALTH VIRTUAL OBSTETRICS VISIT ENCOUNTER NOTE  I connected with Sara Bradford on 12/20/19 at  9:15 AM EDT by telephone at home and verified that I am speaking with the correct person using two identifiers.   I discussed the limitations, risks, security and privacy concerns of performing an evaluation and management service by telephone and the availability of in person appointments. I also discussed with the patient that there may be a patient responsible charge related to this service. The patient expressed understanding and agreed to proceed.  Subjective:  Sara Bradford is a 24 y.o. G1P0 at [redacted]w[redacted]d being followed for ongoing prenatal care.  She is currently monitored for the following issues for this low-risk pregnancy and has Supervision of low-risk pregnancy on their problem list.  Patient reports no complaints. Reports fetal movement. Denies any contractions, bleeding or leaking of fluid.   The following portions of the patient's history were reviewed and updated as appropriate: allergies, current medications, past family history, past medical history, past social history, past surgical history and problem list.   Objective:   General:  Alert, oriented and cooperative.   Mental Status: Normal mood and affect perceived. Normal judgment and thought content.  Rest of physical exam deferred due to type of encounter  Assessment and Plan:  Pregnancy: G1P0 at [redacted]w[redacted]d 1. Encounter for supervision of low-risk pregnancy in second trimester  BP good today 128/84 Good fetal movements.  Next visit in 4 weeks, for 2 hour GTT Discussed [facility], encouraged virtual classes, and tour Discussed IP circ and covered cost by medicaid.  Good fetal movement.  Preterm labor symptoms and general obstetric precautions including but not limited to vaginal bleeding, contractions, leaking of fluid and fetal movement were reviewed in detail with the patient.  I discussed the  assessment and treatment plan with the patient. The patient was provided an opportunity to ask questions and all were answered. The patient agreed with the plan and demonstrated an understanding of the instructions. The patient was advised to call back or seek an in-person office evaluation/go to MAU at [facility] for any urgent or concerning symptoms. Please refer to After Visit Summary for other counseling recommendations.   I provided 10 minutes of non-face-to-face time during this encounter.  Return in about 4 weeks (around 01/17/2020) for For person visit for 2 hour GTT.  No future appointments.  Venia Carbon, NP Center for [employer], [facility]

## 2020-01-17 ENCOUNTER — Other Ambulatory Visit: Payer: Self-pay | Admitting: General Practice

## 2020-01-17 ENCOUNTER — Other Ambulatory Visit: Payer: Self-pay

## 2020-01-17 ENCOUNTER — Ambulatory Visit (INDEPENDENT_AMBULATORY_CARE_PROVIDER_SITE_OTHER): Payer: Medicaid Other | Admitting: Obstetrics and Gynecology

## 2020-01-17 ENCOUNTER — Other Ambulatory Visit: Payer: Medicaid Other

## 2020-01-17 VITALS — BP 119/83 | HR 108 | Wt 174.0 lb

## 2020-01-17 DIAGNOSIS — Z3403 Encounter for supervision of normal first pregnancy, third trimester: Secondary | ICD-10-CM

## 2020-01-17 DIAGNOSIS — Z3A28 28 weeks gestation of pregnancy: Secondary | ICD-10-CM

## 2020-01-17 DIAGNOSIS — Z23 Encounter for immunization: Secondary | ICD-10-CM

## 2020-01-17 DIAGNOSIS — Z3492 Encounter for supervision of normal pregnancy, unspecified, second trimester: Secondary | ICD-10-CM

## 2020-01-17 DIAGNOSIS — Z3493 Encounter for supervision of normal pregnancy, unspecified, third trimester: Secondary | ICD-10-CM

## 2020-01-17 NOTE — Progress Notes (Signed)
   PRENATAL VISIT NOTE  Subjective:  Sara Bradford is a 24 y.o. G1P0 at [redacted]w[redacted]d being seen today for ongoing prenatal care.  She is currently monitored for the following issues for this low-risk pregnancy and has Supervision of low-risk pregnancy on their problem list.  Patient reports no complaints.  Contractions: Not present. Vag. Bleeding: None.  Movement: Present. Denies leaking of fluid.   The following portions of the patient's history were reviewed and updated as appropriate: allergies, current medications, past family history, past medical history, past social history, past surgical history and problem list.   Objective:   Vitals:   01/17/20 0921  BP: 119/83  Pulse: (!) 108  Weight: 174 lb (78.9 kg)    Fetal Status: Fetal Heart Rate (bpm): 153 Fundal Height: 29 cm Movement: Present     General:  Alert, oriented and cooperative. Patient is in no acute distress.  Skin: Skin is warm and dry. No rash noted.   Cardiovascular: Normal heart rate noted  Respiratory: Normal respiratory effort, no problems with respiration noted  Abdomen: Soft, gravid, appropriate for gestational age.  Pain/Pressure: Absent     Pelvic: Cervical exam deferred        Extremities: Normal range of motion.  Edema: None  Mental Status: Normal mood and affect. Normal behavior. Normal judgment and thought content.   Assessment and Plan:  Pregnancy: G1P0 at [redacted]w[redacted]d 1. Encounter for supervision of low-risk pregnancy in second trimester  - BP good today - BOY - Tdap vaccine greater than or equal to 7yo IM - 2 hour today.   Preterm labor symptoms and general obstetric precautions including but not limited to vaginal bleeding, contractions, leaking of fluid and fetal movement were reviewed in detail with the patient. Please refer to After Visit Summary for other counseling recommendations.   Return in about 2 weeks (around 01/31/2020) for In person or virtual. .  Future Appointments  Date Time Provider  Department Center  01/17/2020 10:55 AM Cedricka Sackrider, Harolyn Rutherford, NP [facility]    Venia Carbon, NP

## 2020-01-18 LAB — CBC
Hematocrit: 36.2 % (ref 34.0–46.6)
Hemoglobin: 11.8 g/dL (ref 11.1–15.9)
MCH: 29.7 pg (ref 26.6–33.0)
MCHC: 32.6 g/dL (ref 31.5–35.7)
MCV: 91 fL (ref 79–97)
Platelets: 213 10*3/uL (ref 150–450)
RBC: 3.97 x10E6/uL (ref 3.77–5.28)
RDW: 13 % (ref 11.7–15.4)
WBC: 13.2 10*3/uL — ABNORMAL HIGH (ref 3.4–10.8)

## 2020-01-18 LAB — GLUCOSE TOLERANCE, 2 HOURS W/ 1HR
Glucose, 1 hour: 138 mg/dL (ref 65–179)
Glucose, 2 hour: 120 mg/dL (ref 65–152)
Glucose, Fasting: 73 mg/dL (ref 65–91)

## 2020-01-18 LAB — RPR: RPR Ser Ql: NONREACTIVE

## 2020-01-18 LAB — HIV ANTIBODY (ROUTINE TESTING W REFLEX): HIV Screen 4th Generation wRfx: NONREACTIVE

## 2020-02-05 ENCOUNTER — Telehealth (INDEPENDENT_AMBULATORY_CARE_PROVIDER_SITE_OTHER): Payer: Medicaid Other | Admitting: Student

## 2020-02-05 VITALS — BP 129/83

## 2020-02-05 DIAGNOSIS — Z3403 Encounter for supervision of normal first pregnancy, third trimester: Secondary | ICD-10-CM

## 2020-02-05 DIAGNOSIS — Z3492 Encounter for supervision of normal pregnancy, unspecified, second trimester: Secondary | ICD-10-CM

## 2020-02-05 DIAGNOSIS — Z3A30 30 weeks gestation of pregnancy: Secondary | ICD-10-CM

## 2020-02-05 NOTE — Progress Notes (Signed)
I connected with  Charlesetta Garibaldi on 02/05/20 at  2:15 PM EDT by telephone and verified that I am speaking with the correct person using two identifiers.   I discussed the limitations, risks, security and privacy concerns of performing an evaluation and management service by telephone and the availability of in person appointments. I also discussed with the patient that there may be a patient responsible charge related to this service. The patient expressed understanding and agreed to proceed.  Janene Madeira Andrea Ferrer, CMA 02/05/2020  2:06 PM

## 2020-02-05 NOTE — Progress Notes (Signed)
I connected with@ on 02/05/20 at  2:15 PM EDT by: MyChart video and verified that I am speaking with the correct person using two identifiers.  Patient is located at home and provider is located at [facility].     The purpose of this virtual visit is to provide medical care while limiting exposure to the novel coronavirus. I discussed the limitations, risks, security and privacy concerns of performing an evaluation and management service by MyChart video and the availability of in person appointments. I also discussed with the patient that there may be a patient responsible charge related to this service. By engaging in this virtual visit, you consent to the provision of healthcare.  Additionally, you authorize for your insurance to be billed for the services provided during this visit.  The patient expressed understanding and agreed to proceed.  The following staff members participated in the virtual visit:  Virgina Evener CMA    PRENATAL VISIT NOTE  Subjective:  Sara Bradford is a 24 y.o. G1P0 at [redacted]w[redacted]d  for phone visit for ongoing prenatal care.  She is currently monitored for the following issues for this low-risk pregnancy and has Supervision of low-risk pregnancy on their problem list.  Patient reports no complaints.  Contractions: Not present. Vag. Bleeding: None.  Movement: Present. Denies leaking of fluid.   The following portions of the patient's history were reviewed and updated as appropriate: allergies, current medications, past family history, past medical history, past social history, past surgical history and problem list.   Objective:   Vitals:   02/05/20 1406  BP: 129/83   Self-Obtained  Fetal Status:     Movement: Present     Assessment and Plan:  Pregnancy: G1P0 at [redacted]w[redacted]d 1. Encounter for supervision of low-risk pregnancy in second trimester -doing well. Reviewed upcoming visits. Reviewed previous labs. Given map of [facility]    Preterm labor symptoms and general obstetric  precautions including but not limited to vaginal bleeding, contractions, leaking of fluid and fetal movement were reviewed in detail with the patient.  No follow-ups on file.  Future Appointments  Date Time Provider Department Center  02/19/2020  2:15 PM Kathlene Cote [facility]     Time spent on virtual visit: 7 minutes  Judeth Horn, NP

## 2020-02-05 NOTE — Patient Instructions (Signed)
The Maternity Assessment Unit (MAU) is located at the [facility] and [place] [facility]. The address is: [address] [place], [place],  [address]. Please see map below for additional directions.    The Maternity Assessment Unit is designed to help you during your pregnancy, and for up to 6 weeks after delivery, with any pregnancy- or postpartum-related emergencies, if you think you are in labor, or if your water has broken. For example, if you experience nausea and vomiting, vaginal bleeding, severe abdominal or pelvic pain, elevated blood pressure or other problems related to your pregnancy or postpartum time, please come to the Maternity Assessment Unit for assistance.     Third Trimester of Pregnancy The third trimester is from week 28 through week 40 (months 7 through 9). The third trimester is a time when the unborn baby (fetus) is growing rapidly. At the end of the ninth month, the fetus is about 20 inches in length and weighs 6-10 pounds. Body changes during your third trimester Your body will continue to go through many changes during pregnancy. The changes vary from woman to woman. During the third trimester:  Your weight will continue to increase. You can expect to gain 25-35 pounds (11-16 kg) by the end of the pregnancy.  You may begin to get stretch marks on your hips, abdomen, and breasts.  You may urinate more often because the fetus is moving lower into your pelvis and pressing on your bladder.  You may develop or continue to have heartburn. This is caused by increased hormones that slow down muscles in the digestive tract.  You may develop or continue to have constipation because increased hormones slow digestion and cause the muscles that push waste through your intestines to relax.  You may develop hemorrhoids. These are swollen veins (varicose veins) in the rectum that can itch or be painful.  You may develop swollen, bulging veins  (varicose veins) in your legs.  You may have increased body aches in the pelvis, back, or thighs. This is due to weight gain and increased hormones that are relaxing your joints.  You may have changes in your hair. These can include thickening of your hair, rapid growth, and changes in texture. Some women also have hair loss during or after pregnancy, or hair that feels dry or thin. Your hair will most likely return to normal after your baby is born.  Your breasts will continue to grow and they will continue to become tender. A yellow fluid (colostrum) may leak from your breasts. This is the first milk you are producing for your baby.  Your belly button may stick out.  You may notice more swelling in your hands, face, or ankles.  You may have increased tingling or numbness in your hands, arms, and legs. The skin on your belly may also feel numb.  You may feel short of breath because of your expanding uterus.  You may have more problems sleeping. This can be caused by the size of your belly, increased need to urinate, and an increase in your body's metabolism.  You may notice the fetus "dropping," or moving lower in your abdomen (lightening).  You may have increased vaginal discharge.  You may notice your joints feel loose and you may have pain around your pelvic bone. What to expect at prenatal visits You will have prenatal exams every 2 weeks until week 36. Then you will have weekly prenatal exams. During a routine prenatal visit:  You will  be weighed to make sure you and the baby are growing normally.  Your blood pressure will be taken.  Your abdomen will be measured to track your baby's growth.  The fetal heartbeat will be listened to.  Any test results from the previous visit will be discussed.  You may have a cervical check near your due date to see if your cervix has softened or thinned (effaced).  You will be tested for Group B streptococcus. This happens between 35 and  37 weeks. Your health care provider may ask you:  What your birth plan is.  How you are feeling.  If you are feeling the baby move.  If you have had any abnormal symptoms, such as leaking fluid, bleeding, severe headaches, or abdominal cramping.  If you are using any tobacco products, including cigarettes, chewing tobacco, and electronic cigarettes.  If you have any questions. Other tests or screenings that may be performed during your third trimester include:  Blood tests that check for low iron levels (anemia).  Fetal testing to check the health, activity level, and growth of the fetus. Testing is done if you have certain medical conditions or if there are problems during the pregnancy.  Nonstress test (NST). This test checks the health of your baby to make sure there are no signs of problems, such as the baby not getting enough oxygen. During this test, a belt is placed around your belly. The baby is made to move, and its heart rate is monitored during movement. What is false labor? False labor is a condition in which you feel small, irregular tightenings of the muscles in the womb (contractions) that usually go away with rest, changing position, or drinking water. These are called Braxton Hicks contractions. Contractions may last for hours, days, or even weeks before true labor sets in. If contractions come at regular intervals, become more frequent, increase in intensity, or become painful, you should see your health care provider. What are the signs of labor?  Abdominal cramps.  Regular contractions that start at 10 minutes apart and become stronger and more frequent with time.  Contractions that start on the top of the uterus and spread down to the lower abdomen and back.  Increased pelvic pressure and dull back pain.  A watery or bloody mucus discharge that comes from the vagina.  Leaking of amniotic fluid. This is also known as your "water breaking." It could be a slow  trickle or a gush. Let your health care provider know if it has a color or strange odor. If you have any of these signs, call your health care provider right away, even if it is before your due date. Follow these instructions at home: Medicines  Follow your health care provider's instructions regarding medicine use. Specific medicines may be either safe or unsafe to take during pregnancy.  Take a prenatal vitamin that contains at least 600 micrograms (mcg) of folic acid.  If you develop constipation, try taking a stool softener if your health care provider approves. Eating and drinking   Eat a balanced diet that includes fresh fruits and vegetables, whole grains, good sources of protein such as meat, eggs, or tofu, and low-fat dairy. Your health care provider will help you determine the amount of weight gain that is right for you.  Avoid raw meat and uncooked cheese. These carry germs that can cause birth defects in the baby.  If you have low calcium intake from food, talk to your health care provider about  whether you should take a daily calcium supplement.  Eat four or five small meals rather than three large meals a day.  Limit foods that are high in fat and processed sugars, such as fried and sweet foods.  To prevent constipation: ? Drink enough fluid to keep your urine clear or pale yellow. ? Eat foods that are high in fiber, such as fresh fruits and vegetables, whole grains, and beans. Activity  Exercise only as directed by your health care provider. Most women can continue their usual exercise routine during pregnancy. Try to exercise for 30 minutes at least 5 days a week. Stop exercising if you experience uterine contractions.  Avoid heavy lifting.  Do not exercise in extreme heat or humidity, or at high altitudes.  Wear low-heel, comfortable shoes.  Practice good posture.  You may continue to have sex unless your health care provider tells you otherwise. Relieving pain  and discomfort  Take frequent breaks and rest with your legs elevated if you have leg cramps or low back pain.  Take warm sitz baths to soothe any pain or discomfort caused by hemorrhoids. Use hemorrhoid cream if your health care provider approves.  Wear a good support bra to prevent discomfort from breast tenderness.  If you develop varicose veins: ? Wear support pantyhose or compression stockings as told by your healthcare provider. ? Elevate your feet for 15 minutes, 3-4 times a day. Prenatal care  Write down your questions. Take them to your prenatal visits.  Keep all your prenatal visits as told by your health care provider. This is important. Safety  Wear your seat belt at all times when driving.  Make a list of emergency phone numbers, including numbers for family, friends, the hospital, and police and fire departments. General instructions  Avoid cat litter boxes and soil used by cats. These carry germs that can cause birth defects in the baby. If you have a cat, ask someone to clean the litter box for you.  Do not travel far distances unless it is absolutely necessary and only with the approval of your health care provider.  Do not use hot tubs, steam rooms, or saunas.  Do not drink alcohol.  Do not use any products that contain nicotine or tobacco, such as cigarettes and e-cigarettes. If you need help quitting, ask your health care provider.  Do not use any medicinal herbs or unprescribed drugs. These chemicals affect the formation and growth of the baby.  Do not douche or use tampons or scented sanitary pads.  Do not cross your legs for long periods of time.  To prepare for the arrival of your baby: ? Take prenatal classes to understand, practice, and ask questions about labor and delivery. ? Make a trial run to the hospital. ? Visit the hospital and tour the maternity area. ? Arrange for maternity or paternity leave through employers. ? Arrange for family and  friends to take care of pets while you are in the hospital. ? Purchase a rear-facing car seat and make sure you know how to install it in your car. ? Pack your hospital bag. ? Prepare the baby's nursery. Make sure to remove all pillows and stuffed animals from the baby's crib to prevent suffocation.  Visit your dentist if you have not gone during your pregnancy. Use a soft toothbrush to brush your teeth and be gentle when you floss. Contact a health care provider if:  You are unsure if you are in labor or if your water  has broken.  You become dizzy.  You have mild pelvic cramps, pelvic pressure, or nagging pain in your abdominal area.  You have lower back pain.  You have persistent nausea, vomiting, or diarrhea.  You have an unusual or bad smelling vaginal discharge.  You have pain when you urinate. Get help right away if:  Your water breaks before 37 weeks.  You have regular contractions less than 5 minutes apart before 37 weeks.  You have a fever.  You are leaking fluid from your vagina.  You have spotting or bleeding from your vagina.  You have severe abdominal pain or cramping.  You have rapid weight loss or weight gain.  You have shortness of breath with chest pain.  You notice sudden or extreme swelling of your face, hands, ankles, feet, or legs.  Your baby makes fewer than 10 movements in 2 hours.  You have severe headaches that do not go away when you take medicine.  You have vision changes. Summary  The third trimester is from week 28 through week 40, months 7 through 9. The third trimester is a time when the unborn baby (fetus) is growing rapidly.  During the third trimester, your discomfort may increase as you and your baby continue to gain weight. You may have abdominal, leg, and back pain, sleeping problems, and an increased need to urinate.  During the third trimester your breasts will keep growing and they will continue to become tender. A yellow  fluid (colostrum) may leak from your breasts. This is the first milk you are producing for your baby.  False labor is a condition in which you feel small, irregular tightenings of the muscles in the womb (contractions) that eventually go away. These are called Braxton Hicks contractions. Contractions may last for hours, days, or even weeks before true labor sets in.  Signs of labor can include: abdominal cramps; regular contractions that start at 10 minutes apart and become stronger and more frequent with time; watery or bloody mucus discharge that comes from the vagina; increased pelvic pressure and dull back pain; and leaking of amniotic fluid. This information is not intended to replace advice given to you by your health care provider. Make sure you discuss any questions you have with your health care provider. Document Revised: 12/07/2018 Document Reviewed: 09/21/2016 Elsevier Patient Education  2020 [employer].

## 2020-02-19 ENCOUNTER — Telehealth (INDEPENDENT_AMBULATORY_CARE_PROVIDER_SITE_OTHER): Payer: Medicaid Other | Admitting: Medical

## 2020-02-19 ENCOUNTER — Encounter: Payer: Self-pay | Admitting: Medical

## 2020-02-19 DIAGNOSIS — Z3403 Encounter for supervision of normal first pregnancy, third trimester: Secondary | ICD-10-CM

## 2020-02-19 DIAGNOSIS — Z3A32 32 weeks gestation of pregnancy: Secondary | ICD-10-CM

## 2020-02-19 DIAGNOSIS — Z3493 Encounter for supervision of normal pregnancy, unspecified, third trimester: Secondary | ICD-10-CM

## 2020-02-19 NOTE — Patient Instructions (Signed)
Fetal Movement Counts Patient Name: ________________________________________________ Patient Due Date: ____________________ What is a fetal movement count?  A fetal movement count is the number of times that you feel your baby move during a certain amount of time. This may also be called a fetal kick count. A fetal movement count is recommended for every pregnant woman. You may be asked to start counting fetal movements as early as week 28 of your pregnancy. Pay attention to when your baby is most active. You may notice your baby's sleep and wake cycles. You may also notice things that make your baby move more. You should do a fetal movement count:  When your baby is normally most active.  At the same time each day. A good time to count movements is while you are resting, after having something to eat and drink. How do I count fetal movements? 1. Find a quiet, comfortable area. Sit, or lie down on your side. 2. Write down the date, the start time and stop time, and the number of movements that you felt between those two times. Take this information with you to your health care visits. 3. Write down your start time when you feel the first movement. 4. Count kicks, flutters, swishes, rolls, and jabs. You should feel at least 10 movements. 5. You may stop counting after you have felt 10 movements, or if you have been counting for 2 hours. Write down the stop time. 6. If you do not feel 10 movements in 2 hours, contact your health care provider for further instructions. Your health care provider may want to do additional tests to assess your baby's well-being. Contact a health care provider if:  You feel fewer than 10 movements in 2 hours.  Your baby is not moving like he or she usually does. Date: ____________ Start time: ____________ Stop time: ____________ Movements: ____________ Date: ____________ Start time: ____________ Stop time: ____________ Movements: ____________ Date: ____________  Start time: ____________ Stop time: ____________ Movements: ____________ Date: ____________ Start time: ____________ Stop time: ____________ Movements: ____________ Date: ____________ Start time: ____________ Stop time: ____________ Movements: ____________ Date: ____________ Start time: ____________ Stop time: ____________ Movements: ____________ Date: ____________ Start time: ____________ Stop time: ____________ Movements: ____________ Date: ____________ Start time: ____________ Stop time: ____________ Movements: ____________ Date: ____________ Start time: ____________ Stop time: ____________ Movements: ____________ This information is not intended to replace advice given to you by your health care provider. Make sure you discuss any questions you have with your health care provider. Document Revised: 04/05/2019 Document Reviewed: 04/05/2019 Elsevier Patient Education  2020 [employer] Braxton Hicks Contractions Contractions of the uterus can occur throughout pregnancy, but they are not always a sign that you are in labor. You may have practice contractions called Braxton Hicks contractions. These false labor contractions are sometimes confused with true labor. What are Braxton Hicks contractions? Braxton Hicks contractions are tightening movements that occur in the muscles of the uterus before labor. Unlike true labor contractions, these contractions do not result in opening (dilation) and thinning of the cervix. Toward the end of pregnancy (32-34 weeks), Braxton Hicks contractions can happen more often and may become stronger. These contractions are sometimes difficult to tell apart from true labor because they can be very uncomfortable. You should not feel embarrassed if you go to the hospital with false labor. Sometimes, the only way to tell if you are in true labor is for your health care provider to look for changes in the cervix. The health care provider   will do a physical exam and may  monitor your contractions. If you are not in true labor, the exam should show that your cervix is not dilating and your water has not broken. If there are no other health problems associated with your pregnancy, it is completely safe for you to be sent home with false labor. You may continue to have Braxton Hicks contractions until you go into true labor. How to tell the difference between true labor and false labor True labor  Contractions last 30-70 seconds.  Contractions become very regular.  Discomfort is usually felt in the top of the uterus, and it spreads to the lower abdomen and low back.  Contractions do not go away with walking.  Contractions usually become more intense and increase in frequency.  The cervix dilates and gets thinner. False labor  Contractions are usually shorter and not as strong as true labor contractions.  Contractions are usually irregular.  Contractions are often felt in the front of the lower abdomen and in the groin.  Contractions may go away when you walk around or change positions while lying down.  Contractions get weaker and are shorter-lasting as time goes on.  The cervix usually does not dilate or become thin. Follow these instructions at home:   Take over-the-counter and prescription medicines only as told by your health care provider.  Keep up with your usual exercises and follow other instructions from your health care provider.  Eat and drink lightly if you think you are going into labor.  If Braxton Hicks contractions are making you uncomfortable: ? Change your position from lying down or resting to walking, or change from walking to resting. ? Sit and rest in a tub of warm water. ? Drink enough fluid to keep your urine pale yellow. Dehydration may cause these contractions. ? Do slow and deep breathing several times an hour.  Keep all follow-up prenatal visits as told by your health care provider. This is important. Contact a  health care provider if:  You have a fever.  You have continuous pain in your abdomen. Get help right away if:  Your contractions become stronger, more regular, and closer together.  You have fluid leaking or gushing from your vagina.  You pass blood-tinged mucus (bloody show).  You have bleeding from your vagina.  You have low back pain that you never had before.  You feel your baby's head pushing down and causing pelvic pressure.  Your baby is not moving inside you as much as it used to. Summary  Contractions that occur before labor are called Braxton Hicks contractions, false labor, or practice contractions.  Braxton Hicks contractions are usually shorter, weaker, farther apart, and less regular than true labor contractions. True labor contractions usually become progressively stronger and regular, and they become more frequent.  Manage discomfort from Braxton Hicks contractions by changing position, resting in a warm bath, drinking plenty of water, or practicing deep breathing. This information is not intended to replace advice given to you by your health care provider. Make sure you discuss any questions you have with your health care provider. Document Revised: 07/29/2017 Document Reviewed: 12/30/2016 Elsevier Patient Education  2020 Elsevier Inc[employer]  

## 2020-02-19 NOTE — Progress Notes (Signed)
I connected with Sara Bradford on 02/19/20 at  2:15 PM EDT by: MyChart video and verified that I am speaking with the correct person using two identifiers.  Patient is located at home and provider is located at [facility].     The purpose of this virtual visit is to provide medical care while limiting exposure to the novel coronavirus. I discussed the limitations, risks, security and privacy concerns of performing an evaluation and management service by MyChart video and the availability of in person appointments. I also discussed with the patient that there may be a patient responsible charge related to this service. By engaging in this virtual visit, you consent to the provision of healthcare.  Additionally, you authorize for your insurance to be billed for the services provided during this visit.  The patient expressed understanding and agreed to proceed.  The following staff members participated in the virtual visit:  Corinda Gubler, CMA    PRENATAL VISIT NOTE  Subjective:  Sara Bradford is a 24 y.o. G1P0 at [redacted]w[redacted]d  for phone visit for ongoing prenatal care.  She is currently monitored for the following issues for this low-risk pregnancy and has Supervision of low-risk pregnancy on their problem list.  Patient reports no complaints.  Contractions: Not present. Vag. Bleeding: None.  Movement: Present. Denies leaking of fluid.   The following portions of the patient's history were reviewed and updated as appropriate: allergies, current medications, past family history, past medical history, past social history, past surgical history and problem list.   Objective:   Vitals:   02/19/20 1416  BP: 126/83  Pulse: (!) 101   Self-Obtained  Fetal Status:     Movement: Present     Assessment and Plan:  Pregnancy: G1P0 at [redacted]w[redacted]d 1. Encounter for supervision of low-risk pregnancy in third trimester - Doing well - Considering POPs with Breastfeeding for MOC  Preterm labor symptoms and general  obstetric precautions including but not limited to vaginal bleeding, contractions, leaking of fluid and fetal movement were reviewed in detail with the patient.  Return in about 4 weeks (around 03/18/2020) for LOB, In-Person, Babyscipts.  No future appointments.   Time spent on virtual visit: 5 minutes  Vonzella Nipple, PA-C

## 2020-02-19 NOTE — Progress Notes (Signed)
I connected with  Charlesetta Garibaldi on 02/19/20 at  2:15 PM EDT by telephone and verified that I am speaking with the correct person using two identifiers.   I discussed the limitations, risks, security and privacy concerns of performing an evaluation and management service by telephone and the availability of in person appointments. I also discussed with the patient that there may be a patient responsible charge related to this service. The patient expressed understanding and agreed to proceed.  Henrietta Dine, CMA 02/19/2020  2:12 PM

## 2020-02-20 ENCOUNTER — Encounter: Payer: Self-pay | Admitting: Medical

## 2020-03-05 ENCOUNTER — Encounter: Payer: Self-pay | Admitting: Medical

## 2020-03-05 ENCOUNTER — Ambulatory Visit (INDEPENDENT_AMBULATORY_CARE_PROVIDER_SITE_OTHER): Payer: Medicaid Other | Admitting: Medical

## 2020-03-05 ENCOUNTER — Other Ambulatory Visit: Payer: Self-pay

## 2020-03-05 VITALS — BP 125/91 | HR 98 | Wt 179.0 lb

## 2020-03-05 DIAGNOSIS — Z3493 Encounter for supervision of normal pregnancy, unspecified, third trimester: Secondary | ICD-10-CM

## 2020-03-05 DIAGNOSIS — Z3A34 34 weeks gestation of pregnancy: Secondary | ICD-10-CM

## 2020-03-05 DIAGNOSIS — O163 Unspecified maternal hypertension, third trimester: Secondary | ICD-10-CM | POA: Diagnosis not present

## 2020-03-05 NOTE — Progress Notes (Signed)
   PRENATAL VISIT NOTE  Subjective:  Sara Bradford is a 24 y.o. G1P0 at 59w6dbeing seen today for ongoing prenatal care.  She is currently monitored for the following issues for this low-risk pregnancy and has Supervision of low-risk pregnancy on their problem list.  Patient reports no complaints.  Contractions: Not present. Vag. Bleeding: None.  Movement: Present. Denies leaking of fluid.   The following portions of the patient's history were reviewed and updated as appropriate: allergies, current medications, past family history, past medical history, past social history, past surgical history and problem list.   Objective:   Vitals:   03/05/20 0824 03/05/20 0831  BP: (!) 126/93 (!) 125/91  Pulse: 98   Weight: 179 lb (81.2 kg)     Fetal Status: Fetal Heart Rate (bpm): 141 Fundal Height: 36 cm Movement: Present     General:  Alert, oriented and cooperative. Patient is in no acute distress.  Skin: Skin is warm and dry. No rash noted.   Cardiovascular: Normal heart rate noted  Respiratory: Normal respiratory effort, no problems with respiration noted  Abdomen: Soft, gravid, appropriate for gestational age.  Pain/Pressure: Absent     Pelvic: Cervical exam deferred        Extremities: Normal range of motion.  Edema: None  Mental Status: Normal mood and affect. Normal behavior. Normal judgment and thought content.   Assessment and Plan:  Pregnancy: G1P0 at 331w6d. Encounter for supervision of low-risk pregnancy in third trimester - Anticipatory guidance for next visit discussed including need for GC/Chlamydia and GBS at that visit  2. Elevated blood pressure affecting pregnancy in third trimester, antepartum - Mild elevation of BP today - Warning symptoms for worsening HTN discussed - Patient to take BP weekly unless symptoms arise - Denies HA, visual changes, peripheral edema or RUQ abdominal pain today  - CBC - Comp Met (CMET) - Protein / creatinine ratio,  urine  Preterm labor symptoms and general obstetric precautions including but not limited to vaginal bleeding, contractions, leaking of fluid and fetal movement were reviewed in detail with the patient. Please refer to After Visit Summary for other counseling recommendations.   Return in about 2 weeks (around 03/19/2020) for LOB, In-Person, any provider.  Future Appointments  Date Time Provider [place]7/20/2021  1:35 PM KoBrown [facility]  JuKerry HoughPA-C

## 2020-03-05 NOTE — Patient Instructions (Addendum)
Fetal Movement Counts °Patient Name: ________________________________________________ Patient Due Date: ____________________ °What is a fetal movement count? ° °A fetal movement count is the number of times that you feel your baby move during a certain amount of time. This may also be called a fetal kick count. A fetal movement count is recommended for every pregnant woman. You may be asked to start counting fetal movements as early as week 28 of your pregnancy. °Pay attention to when your baby is most active. You may notice your baby's sleep and wake cycles. You may also notice things that make your baby move more. You should do a fetal movement count: °· When your baby is normally most active. °· At the same time each day. °A good time to count movements is while you are resting, after having something to eat and drink. °How do I count fetal movements? °1. Find a quiet, comfortable area. Sit, or lie down on your side. °2. Write down the date, the start time and stop time, and the number of movements that you felt between those two times. Take this information with you to your health care visits. °3. Write down your start time when you feel the first movement. °4. Count kicks, flutters, swishes, rolls, and jabs. You should feel at least 10 movements. °5. You may stop counting after you have felt 10 movements, or if you have been counting for 2 hours. Write down the stop time. °6. If you do not feel 10 movements in 2 hours, contact your health care provider for further instructions. Your health care provider may want to do additional tests to assess your baby's well-being. °Contact a health care provider if: °· You feel fewer than 10 movements in 2 hours. °· Your baby is not moving like he or she usually does. °Date: ____________ Start time: ____________ Stop time: ____________ Movements: ____________ °Date: ____________ Start time: ____________ Stop time: ____________ Movements: ____________ °Date: ____________  Start time: ____________ Stop time: ____________ Movements: ____________ °Date: ____________ Start time: ____________ Stop time: ____________ Movements: ____________ °Date: ____________ Start time: ____________ Stop time: ____________ Movements: ____________ °Date: ____________ Start time: ____________ Stop time: ____________ Movements: ____________ °Date: ____________ Start time: ____________ Stop time: ____________ Movements: ____________ °Date: ____________ Start time: ____________ Stop time: ____________ Movements: ____________ °Date: ____________ Start time: ____________ Stop time: ____________ Movements: ____________ °This information is not intended to replace advice given to you by your health care provider. Make sure you discuss any questions you have with your health care provider. °Document Revised: 04/05/2019 Document Reviewed: 04/05/2019 °Elsevier Patient Education © 2020 Elsevier Inc. °Braxton Hicks Contractions °Contractions of the uterus can occur throughout pregnancy, but they are not always a sign that you are in labor. You may have practice contractions called Braxton Hicks contractions. These false labor contractions are sometimes confused with true labor. °What are Braxton Hicks contractions? °Braxton Hicks contractions are tightening movements that occur in the muscles of the uterus before labor. Unlike true labor contractions, these contractions do not result in opening (dilation) and thinning of the cervix. Toward the end of pregnancy (32-34 weeks), Braxton Hicks contractions can happen more often and may become stronger. These contractions are sometimes difficult to tell apart from true labor because they can be very uncomfortable. You should not feel embarrassed if you go to the hospital with false labor. °Sometimes, the only way to tell if you are in true labor is for your health care provider to look for changes in the cervix. The health care provider   will do a physical exam and may  monitor your contractions. If you are not in true labor, the exam should show that your cervix is not dilating and your water has not broken. If there are no other health problems associated with your pregnancy, it is completely safe for you to be sent home with false labor. You may continue to have Braxton Hicks contractions until you go into true labor. How to tell the difference between true labor and false labor True labor  Contractions last 30-70 seconds.  Contractions become very regular.  Discomfort is usually felt in the top of the uterus, and it spreads to the lower abdomen and low back.  Contractions do not go away with walking.  Contractions usually become more intense and increase in frequency.  The cervix dilates and gets thinner. False labor  Contractions are usually shorter and not as strong as true labor contractions.  Contractions are usually irregular.  Contractions are often felt in the front of the lower abdomen and in the groin.  Contractions may go away when you walk around or change positions while lying down.  Contractions get weaker and are shorter-lasting as time goes on.  The cervix usually does not dilate or become thin. Follow these instructions at home:   Take over-the-counter and prescription medicines only as told by your health care provider.  Keep up with your usual exercises and follow other instructions from your health care provider.  Eat and drink lightly if you think you are going into labor.  If Braxton Hicks contractions are making you uncomfortable: ? Change your position from lying down or resting to walking, or change from walking to resting. ? Sit and rest in a tub of warm water. ? Drink enough fluid to keep your urine pale yellow. Dehydration may cause these contractions. ? Do slow and deep breathing several times an hour.  Keep all follow-up prenatal visits as told by your health care provider. This is important. Contact a  health care provider if:  You have a fever.  You have continuous pain in your abdomen. Get help right away if:  Your contractions become stronger, more regular, and closer together.  You have fluid leaking or gushing from your vagina.  You pass blood-tinged mucus (bloody show).  You have bleeding from your vagina.  You have low back pain that you never had before.  You feel your babys head pushing down and causing pelvic pressure.  Your baby is not moving inside you as much as it used to. Summary  Contractions that occur before labor are called Braxton Hicks contractions, false labor, or practice contractions.  Braxton Hicks contractions are usually shorter, weaker, farther apart, and less regular than true labor contractions. True labor contractions usually become progressively stronger and regular, and they become more frequent.  Manage discomfort from [facility] contractions by changing position, resting in a warm bath, drinking plenty of water, or practicing deep breathing. This information is not intended to replace advice given to you by your health care provider. Make sure you discuss any questions you have with your health care provider. Document Revised: 07/29/2017 Document Reviewed: 12/30/2016 Elsevier Patient Education  2020 [employer].   [email or web address] --> your pregnancy --> online to-dos before you're due --> sign-up for a class -->    Preeclampsia and Eclampsia Preeclampsia is a serious condition that may develop during pregnancy. This condition causes high blood pressure and increased protein in your urine along with other symptoms, such as  headaches and vision changes. These symptoms may develop as the condition gets worse. Preeclampsia may occur at 20 weeks of pregnancy or later. Diagnosing and treating preeclampsia early is very important. If not treated early, it can cause serious problems for you and your baby. One problem it can lead to is  eclampsia. Eclampsia is a condition that causes muscle jerking or shaking (convulsions or seizures) and other serious problems for the mother. During pregnancy, delivering your baby may be the best treatment for preeclampsia or eclampsia. For most women, preeclampsia and eclampsia symptoms go away after giving birth. In rare cases, a woman may develop preeclampsia after giving birth (postpartum preeclampsia). This usually occurs within 48 hours after childbirth but may occur up to 6 weeks after giving birth. What are the causes? The cause of preeclampsia is not known. What increases the risk? The following risk factors make you more likely to develop preeclampsia:  Being pregnant for the first time.  Having had preeclampsia during a past pregnancy.  Having a family history of preeclampsia.  Having high blood pressure.  Being pregnant with more than one baby.  Being 54 or older.  Being African-American.  Having kidney disease or diabetes.  Having medical conditions such as lupus or blood diseases.  Being very overweight (obese). What are the signs or symptoms? The most common symptoms are:  Severe headaches.  Vision problems, such as blurred or double vision.  Abdominal pain, especially upper abdominal pain. Other symptoms that may develop as the condition gets worse include:  Sudden weight gain.  Sudden swelling of the hands, face, legs, and feet.  Severe nausea and vomiting.  Numbness in the face, arms, legs, and feet.  Dizziness.  Urinating less than usual.  Slurred speech.  Convulsions or seizures. How is this diagnosed? There are no screening tests for preeclampsia. Your health care provider will ask you about symptoms and check for signs of preeclampsia during your prenatal visits. You may also have tests that include:  Checking your blood pressure.  Urine tests to check for protein. Your health care provider will check for this at every prenatal  visit.  Blood tests.  Monitoring your baby's heart rate.  Ultrasound. How is this treated? You and your health care provider will determine the treatment approach that is best for you. Treatment may include:  Having more frequent prenatal exams to check for signs of preeclampsia, if you have an increased risk for preeclampsia.  Medicine to lower your blood pressure.  Staying in the hospital, if your condition is severe. There, treatment will focus on controlling your blood pressure and the amount of fluids in your body (fluid retention).  Taking medicine (magnesium sulfate) to prevent seizures. This may be given as an injection or through an IV.  Taking a low-dose aspirin during your pregnancy.  Delivering your baby early. You may have your labor started with medicine (induced), or you may have a cesarean delivery. Follow these instructions at home: Eating and drinking   Drink enough fluid to keep your urine pale yellow.  Avoid caffeine. Lifestyle  Do not use any products that contain nicotine or tobacco, such as cigarettes and e-cigarettes. If you need help quitting, ask your health care provider.  Do not use alcohol or drugs.  Avoid stress as much as possible. Rest and get plenty of sleep. General instructions  Take over-the-counter and prescription medicines only as told by your health care provider.  When lying down, lie on your left side. This keeps  pressure off your major blood vessels.  When sitting or lying down, raise (elevate) your feet. Try putting some pillows underneath your lower legs.  Exercise regularly. Ask your health care provider what kinds of exercise are best for you.  Keep all follow-up and prenatal visits as told by your health care provider. This is important. How is this prevented? There is no known way of preventing preeclampsia or eclampsia from developing. However, to lower your risk of complications and detect problems early:  Get regular  prenatal care. Your health care provider may be able to diagnose and treat the condition early.  Maintain a healthy weight. Ask your health care provider for help managing weight gain during pregnancy.  Work with your health care provider to manage any long-term (chronic) health conditions you have, such as diabetes or kidney problems.  You may have tests of your blood pressure and kidney function after giving birth.  Your health care provider may have you take low-dose aspirin during your next pregnancy. Contact a health care provider if:  You have symptoms that your health care provider told you may require more treatment or monitoring, such as: ? Headaches. ? Nausea or vomiting. ? Abdominal pain. ? Dizziness. ? Light-headedness. Get help right away if:  You have severe: ? Abdominal pain. ? Headaches that do not get better. ? Dizziness. ? Vision problems. ? Confusion. ? Nausea or vomiting.  You have any of the following: ? A seizure. ? Sudden, rapid weight gain. ? Sudden swelling in your hands, ankles, or face. ? Trouble moving any part of your body. ? Numbness in any part of your body. ? Trouble speaking. ? Abnormal bleeding.  You faint. Summary  Preeclampsia is a serious condition that may develop during pregnancy.  This condition causes high blood pressure and increased protein in your urine along with other symptoms, such as headaches and vision changes.  Diagnosing and treating preeclampsia early is very important. If not treated early, it can cause serious problems for you and your baby.  Get help right away if you have symptoms that your health care provider told you to watch for. This information is not intended to replace advice given to you by your health care provider. Make sure you discuss any questions you have with your health care provider. Document Revised: 04/18/2018 Document Reviewed: 03/22/2016 Elsevier Patient Education  2020 [employer].

## 2020-03-06 LAB — COMPREHENSIVE METABOLIC PANEL
ALT: 6 IU/L (ref 0–32)
AST: 14 IU/L (ref 0–40)
Albumin/Globulin Ratio: 1.3 (ref 1.2–2.2)
Albumin: 3.7 g/dL — ABNORMAL LOW (ref 3.9–5.0)
Alkaline Phosphatase: 103 IU/L (ref 48–121)
BUN/Creatinine Ratio: 6 — ABNORMAL LOW (ref 9–23)
BUN: 4 mg/dL — ABNORMAL LOW (ref 6–20)
Bilirubin Total: 0.4 mg/dL (ref 0.0–1.2)
CO2: 21 mmol/L (ref 20–29)
Calcium: 9.6 mg/dL (ref 8.7–10.2)
Chloride: 103 mmol/L (ref 96–106)
Creatinine, Ser: 0.63 mg/dL (ref 0.57–1.00)
GFR calc Af Amer: 145 mL/min/{1.73_m2} (ref >59)
GFR calc non Af Amer: 126 mL/min/{1.73_m2} (ref >59)
Globulin, Total: 2.8 g/dL (ref 1.5–4.5)
Glucose: 80 mg/dL (ref 65–99)
Potassium: 4 mmol/L (ref 3.5–5.2)
Sodium: 139 mmol/L (ref 134–144)
Total Protein: 6.5 g/dL (ref 6.0–8.5)

## 2020-03-06 LAB — CBC
Hematocrit: 38.7 % (ref 34.0–46.6)
Hemoglobin: 12.7 g/dL (ref 11.1–15.9)
MCH: 30 pg (ref 26.6–33.0)
MCHC: 32.8 g/dL (ref 31.5–35.7)
MCV: 92 fL (ref 79–97)
Platelets: 196 10*3/uL (ref 150–450)
RBC: 4.23 x10E6/uL (ref 3.77–5.28)
RDW: 13.1 % (ref 11.7–15.4)
WBC: 8.9 10*3/uL (ref 3.4–10.8)

## 2020-03-06 LAB — PROTEIN / CREATININE RATIO, URINE
Creatinine, Urine: 160.2 mg/dL
Protein, Ur: 20 mg/dL
Protein/Creat Ratio: 125 mg/g creat (ref 0–200)

## 2020-03-18 ENCOUNTER — Ambulatory Visit (INDEPENDENT_AMBULATORY_CARE_PROVIDER_SITE_OTHER): Payer: Medicaid Other | Admitting: Student

## 2020-03-18 ENCOUNTER — Telehealth (HOSPITAL_COMMUNITY): Payer: Self-pay | Admitting: *Deleted

## 2020-03-18 ENCOUNTER — Other Ambulatory Visit: Payer: Self-pay

## 2020-03-18 ENCOUNTER — Other Ambulatory Visit (HOSPITAL_COMMUNITY)
Admission: RE | Admit: 2020-03-18 | Discharge: 2020-03-18 | Disposition: A | Discharge status: Home / Self Care | Payer: Medicaid Other | Source: Ambulatory Visit | Attending: Student | Admitting: Student

## 2020-03-18 VITALS — BP 135/93 | HR 90 | Wt 183.2 lb

## 2020-03-18 DIAGNOSIS — O139 Gestational [pregnancy-induced] hypertension without significant proteinuria, unspecified trimester: Secondary | ICD-10-CM

## 2020-03-18 DIAGNOSIS — O133 Gestational [pregnancy-induced] hypertension without significant proteinuria, third trimester: Secondary | ICD-10-CM

## 2020-03-18 DIAGNOSIS — Z3A36 36 weeks gestation of pregnancy: Secondary | ICD-10-CM

## 2020-03-18 DIAGNOSIS — Z3493 Encounter for supervision of normal pregnancy, unspecified, third trimester: Secondary | ICD-10-CM | POA: Diagnosis not present

## 2020-03-18 HISTORY — DX: Gestational (pregnancy-induced) hypertension without significant proteinuria, unspecified trimester: O13.9

## 2020-03-18 LAB — POCT URINALYSIS DIP (DEVICE)
Bilirubin Urine: NEGATIVE
Glucose, UA: NEGATIVE mg/dL
Hgb urine dipstick: NEGATIVE
Ketones, ur: NEGATIVE mg/dL
Leukocytes,Ua: NEGATIVE
Nitrite: NEGATIVE
Protein, ur: NEGATIVE mg/dL
Specific Gravity, Urine: 1.015 (ref 1.005–1.030)
Urobilinogen, UA: 0.2 mg/dL (ref 0.0–1.0)
pH: 7 (ref 5.0–8.0)

## 2020-03-18 NOTE — Patient Instructions (Signed)
New Induction of Labor Process for [employer] [facility]  In Fall 2020 [facility] and [facility] changed it's process for scheduling inductions of labor to create more induction slots and to make sure patients get COVID-19 testing in advance. After you have been tested you need to quarantine so that you do not get infected after your test. You should not go anywhere after your test except necessary medical appointments.  You have been scheduled for induction of labor on 7/22.  Although you may have a specific time listed on your After Visit Summary or MyChart, we cannot predict when your room will be available. Please disregard this time. A Labor and Delivery staff member will call you on the day that you are scheduled when your room is available. You will need to arrive within one hour of being called. If you do not arrive within this time frame, the next person on the list will be called in and you will move down the list. You may eat a light meal before coming to the hospital. If you go into labor, think your water has broken, experience bright red bleeding or don't feel your baby moving as much as usual before your induction, please call your Ob/Gyn's office or come to Entrance C, Maternity Assessment Unit for evaluation.  Thank you,  [facility] [employer]       Labor Induction  Labor induction is when steps are taken to cause a pregnant woman to begin the labor process. Most women go into labor on their own between 37 weeks and 42 weeks of pregnancy. When this does not happen or when there is a medical need for labor to begin, steps may be taken to induce labor. Labor induction causes a pregnant woman's uterus to contract. It also causes the cervix to soften (ripen), open (dilate), and thin out (efface). Usually, labor is not induced before 39 weeks of pregnancy unless there is a medical reason to do so. Your health care provider will determine if  labor induction is needed. Before inducing labor, your health care provider will consider a number of factors, including:  Your medical condition and your baby's.  How many weeks along you are in your pregnancy.  How mature your baby's lungs are.  The condition of your cervix.  The position of your baby.  The size of your birth canal. What are some reasons for labor induction? Labor may be induced if:  Your health or your baby's health is at risk.  Your pregnancy is overdue by 1 week or more.  Your water breaks but labor does not start on its own.  There is a low amount of amniotic fluid around your baby. You may also choose (elect) to have labor induced at a certain time. Generally, elective labor induction is done no earlier than 39 weeks of pregnancy. What methods are used for labor induction? Methods used for labor induction include:  Prostaglandin medicine. This medicine starts contractions and causes the cervix to dilate and ripen. It can be taken by mouth (orally) or by being inserted into the vagina (suppository).  Inserting a small, thin tube (catheter) with a balloon into the vagina and then expanding the balloon with water to dilate the cervix.  Stripping the membranes. In this method, your health care provider gently separates amniotic sac tissue from the cervix. This causes the cervix to stretch, which in turn causes the release of a hormone called progesterone. The hormone causes the  uterus to contract. This procedure is often done during an office visit, after which you will be sent home to wait for contractions to begin.  Breaking the water. In this method, your health care provider uses a small instrument to make a small hole in the amniotic sac. This eventually causes the amniotic sac to break. Contractions should begin after a few hours.  Medicine to trigger or strengthen contractions. This medicine is given through an IV that is inserted into a vein in your  arm. Except for membrane stripping, which can be done in a clinic, labor induction is done in the hospital so that you and your baby can be carefully monitored. How long does it take for labor to be induced? The length of time it takes to induce labor depends on how ready your body is for labor. Some inductions can take up to 2-3 days, while others may take less than a day. Induction may take longer if:  You are induced early in your pregnancy.  It is your first pregnancy.  Your cervix is not ready. What are some risks associated with labor induction? Some risks associated with labor induction include:  Changes in fetal heart rate, such as being too high, too low, or irregular (erratic).  Failed induction.  Infection in the mother or the baby.  Increased risk of having a cesarean delivery.  Fetal death.  Breaking off (abruption) of the placenta from the uterus (rare).  Rupture of the uterus (very rare). When induction is needed for medical reasons, the benefits of induction generally outweigh the risks. What are some reasons for not inducing labor? Labor induction should not be done if:  Your baby does not tolerate contractions.  You have had previous surgeries on your uterus, such as a myomectomy, removal of fibroids, or a vertical scar from a previous cesarean delivery.  Your placenta lies very low in your uterus and blocks the opening of the cervix (placenta previa).  Your baby is not in a head-down position.  The umbilical cord drops down into the birth canal in front of the baby.  There are unusual circumstances, such as the baby being very early (premature).  You have had more than 2 previous cesarean deliveries. Summary  Labor induction is when steps are taken to cause a pregnant woman to begin the labor process.  Labor induction causes a pregnant woman's uterus to contract. It also causes the cervix to ripen, dilate, and efface.  Labor is not induced before 39  weeks of pregnancy unless there is a medical reason to do so.  When induction is needed for medical reasons, the benefits of induction generally outweigh the risks. This information is not intended to replace advice given to you by your health care provider. Make sure you discuss any questions you have with your health care provider. Document Revised: 08/19/2017 Document Reviewed: 09/29/2016 Elsevier Patient Education  2020 [employer].

## 2020-03-18 NOTE — Progress Notes (Signed)
   PRENATAL VISIT NOTE  Subjective:  Sara Bradford is a 24 y.o. G1P0 at [redacted]w[redacted]d being seen today for ongoing prenatal care.  She is currently monitored for the following issues for this high-risk pregnancy and has Supervision of low-risk pregnancy and Gestational hypertension on their problem list.  Patient reports no complaints. She denies headaches, blurry vision, floating spots, RUQ pain.   Contractions: Not present. Vag. Bleeding: None.  Movement: Present. Denies leaking of fluid.   The following portions of the patient's history were reviewed and updated as appropriate: allergies, current medications, past family history, past medical history, past social history, past surgical history and problem list.   Objective:   Vitals:   03/18/20 1343  BP: (!) 135/93  Pulse: 90  Weight: 183 lb 3.2 oz (83.1 kg)    Fetal Status: Fetal Heart Rate (bpm): 152 Fundal Height: 35 cm Movement: Present  Presentation: Vertex  General:  Alert, oriented and cooperative. Patient is in no acute distress.  Skin: Skin is warm and dry. No rash noted.   Cardiovascular: Normal heart rate noted  Respiratory: Normal respiratory effort, no problems with respiration noted  Abdomen: Soft, gravid, appropriate for gestational age.  Pain/Pressure: Present     Pelvic: Cervical exam performed in the presence of a chaperone Dilation: Closed Effacement (%): Thick    Extremities: Normal range of motion.  Edema: None  Mental Status: Normal mood and affect. Normal behavior. Normal judgment and thought content.   Assessment and Plan:  Pregnancy: G1P0 at [redacted]w[redacted]d 1. Encounter for supervision of low-risk pregnancy in third trimester -discussed new diagnosis of GHTN; reivewed warning signs and when to come to [facility] -will schedule IOL at 37 weeks; orders placed. Labor team to place GBS orders if culture comes back positive.  -reviewed that labor and delivery will call patient and tell her what day her IOL is, as well as call her  on the day of her IOL. Instructions given on when to come to the hospital.  - Culture, beta strep (group b only) - GC/Chlamydia probe amp ([place])not at [facility]  Term labor symptoms and general obstetric precautions including but not limited to vaginal bleeding, contractions, leaking of fluid and fetal movement were reviewed in detail with the patient. Please refer to After Visit Summary for other counseling recommendations.   No follow-ups on file.  Future Appointments  Date Time Provider Department Center  03/26/2020  8:15 AM Rasch, Harolyn Rutherford, NP [facility]  04/02/2020 10:15 AM Currie Paris, NP [facility]  04/09/2020 10:15 AM Kathlene Cote [facility]    Marylene Land, CNM

## 2020-03-18 NOTE — Telephone Encounter (Signed)
Preadmission screen  

## 2020-03-19 ENCOUNTER — Other Ambulatory Visit (HOSPITAL_COMMUNITY)
Admission: RE | Admit: 2020-03-19 | Discharge: 2020-03-19 | Disposition: A | Discharge status: Home / Self Care | Payer: Medicaid Other | Source: Ambulatory Visit | Attending: Family Medicine | Admitting: Family Medicine

## 2020-03-19 ENCOUNTER — Other Ambulatory Visit (HOSPITAL_COMMUNITY): Payer: Self-pay | Admitting: Family Medicine

## 2020-03-19 DIAGNOSIS — Z20822 Contact with and (suspected) exposure to covid-19: Secondary | ICD-10-CM | POA: Insufficient documentation

## 2020-03-19 DIAGNOSIS — Z01812 Encounter for preprocedural laboratory examination: Secondary | ICD-10-CM | POA: Insufficient documentation

## 2020-03-19 LAB — GC/CHLAMYDIA PROBE AMP (~~LOC~~) NOT AT ARMC
Chlamydia: NEGATIVE
Comment: NEGATIVE
Comment: NORMAL
Neisseria Gonorrhea: NEGATIVE

## 2020-03-19 LAB — SARS CORONAVIRUS 2 (TAT 6-24 HRS): SARS Coronavirus 2: NEGATIVE

## 2020-03-20 ENCOUNTER — Inpatient Hospital Stay (HOSPITAL_COMMUNITY)
Admission: AD | Admit: 2020-03-20 | Discharge: 2020-03-23 | DRG: 807 | Disposition: A | Discharge status: Home / Self Care | Payer: Medicaid Other | Attending: Obstetrics and Gynecology | Admitting: Obstetrics and Gynecology

## 2020-03-20 ENCOUNTER — Other Ambulatory Visit: Payer: Self-pay

## 2020-03-20 ENCOUNTER — Inpatient Hospital Stay (HOSPITAL_COMMUNITY): Payer: Medicaid Other

## 2020-03-20 ENCOUNTER — Encounter (HOSPITAL_COMMUNITY): Payer: Self-pay | Admitting: Student

## 2020-03-20 DIAGNOSIS — Z3A37 37 weeks gestation of pregnancy: Secondary | ICD-10-CM | POA: Diagnosis not present

## 2020-03-20 DIAGNOSIS — Z20822 Contact with and (suspected) exposure to covid-19: Secondary | ICD-10-CM | POA: Diagnosis not present

## 2020-03-20 DIAGNOSIS — O139 Gestational [pregnancy-induced] hypertension without significant proteinuria, unspecified trimester: Secondary | ICD-10-CM | POA: Diagnosis present

## 2020-03-20 DIAGNOSIS — O134 Gestational [pregnancy-induced] hypertension without significant proteinuria, complicating childbirth: Principal | ICD-10-CM | POA: Diagnosis present

## 2020-03-20 LAB — CBC
HCT: 38.5 % (ref 36.0–46.0)
HCT: 35.7 % — ABNORMAL LOW (ref 36.0–46.0)
Hemoglobin: 12.5 g/dL (ref 12.0–15.0)
Hemoglobin: 11.6 g/dL — ABNORMAL LOW (ref 12.0–15.0)
MCH: 29.9 pg (ref 26.0–34.0)
MCH: 30.1 pg (ref 26.0–34.0)
MCHC: 32.5 g/dL (ref 30.0–36.0)
MCHC: 32.5 g/dL (ref 30.0–36.0)
MCV: 92.1 fL (ref 80.0–100.0)
MCV: 92.7 fL (ref 80.0–100.0)
Platelets: 211 10*3/uL (ref 150–400)
Platelets: 192 10*3/uL (ref 150–400)
RBC: 4.18 MIL/uL (ref 3.87–5.11)
RBC: 3.85 MIL/uL — ABNORMAL LOW (ref 3.87–5.11)
RDW: 13.5 % (ref 11.5–15.5)
RDW: 13.6 % (ref 11.5–15.5)
WBC: 7.6 10*3/uL (ref 4.0–10.5)
WBC: 10.8 10*3/uL — ABNORMAL HIGH (ref 4.0–10.5)
nRBC: 0 % (ref 0.0–0.2)
nRBC: 0 % (ref 0.0–0.2)

## 2020-03-20 LAB — COMPREHENSIVE METABOLIC PANEL
ALT: 11 U/L (ref 0–44)
AST: 22 U/L (ref 15–41)
Albumin: 3.1 g/dL — ABNORMAL LOW (ref 3.5–5.0)
Alkaline Phosphatase: 90 U/L (ref 38–126)
Anion gap: 10 (ref 5–15)
BUN: 6 mg/dL (ref 6–20)
CO2: 20 mmol/L — ABNORMAL LOW (ref 22–32)
Calcium: 9.2 mg/dL (ref 8.9–10.3)
Chloride: 106 mmol/L (ref 98–111)
Creatinine, Ser: 0.64 mg/dL (ref 0.44–1.00)
GFR calc Af Amer: >60 mL/min (ref >60)
GFR calc non Af Amer: >60 mL/min (ref >60)
Glucose, Bld: 77 mg/dL (ref 70–99)
Potassium: 3.7 mmol/L (ref 3.5–5.1)
Sodium: 136 mmol/L (ref 135–145)
Total Bilirubin: 0.6 mg/dL (ref 0.3–1.2)
Total Protein: 6.3 g/dL — ABNORMAL LOW (ref 6.5–8.1)

## 2020-03-20 LAB — TYPE AND SCREEN
ABO/RH(D): O POS
Antibody Screen: NEGATIVE

## 2020-03-20 LAB — RPR: RPR Ser Ql: NONREACTIVE

## 2020-03-20 LAB — GROUP B STREP BY PCR: Group B strep by PCR: NEGATIVE

## 2020-03-20 MED ORDER — FENTANYL-BUPIVACAINE-NACL 0.5-0.125-0.9 MG/250ML-% EP SOLN
12.0000 mL/h | EPIDURAL | Status: DC | PRN
  Filled 2020-03-20: qty 250

## 2020-03-20 MED ORDER — MISOPROSTOL 50MCG HALF TABLET: 50.0000 ug | ORAL_TABLET | ORAL | Status: DC

## 2020-03-20 MED ORDER — TERBUTALINE SULFATE 1 MG/ML IJ SOLN: 0.2500 mg | Freq: Once | INTRAMUSCULAR | Status: DC | PRN

## 2020-03-20 MED ORDER — ACETAMINOPHEN 325 MG PO TABS: 650.0000 mg | ORAL_TABLET | ORAL | Status: DC | PRN

## 2020-03-20 MED ORDER — OXYTOCIN BOLUS FROM INFUSION: 333.0000 mL | Freq: Once | INTRAVENOUS | Status: DC

## 2020-03-20 MED ORDER — LIDOCAINE HCL (PF) 1 % IJ SOLN: 30.0000 mL | INTRAMUSCULAR | Status: DC | PRN

## 2020-03-20 MED ORDER — OXYCODONE-ACETAMINOPHEN 5-325 MG PO TABS: 2.0000 | ORAL_TABLET | ORAL | Status: DC | PRN

## 2020-03-20 MED ORDER — DIPHENHYDRAMINE HCL 50 MG/ML IJ SOLN: 12.5000 mg | INTRAMUSCULAR | Status: DC | PRN

## 2020-03-20 MED ORDER — PHENYLEPHRINE 40 MCG/ML (10ML) SYRINGE FOR IV PUSH (FOR BLOOD PRESSURE SUPPORT)
80.0000 ug | PREFILLED_SYRINGE | INTRAVENOUS | Status: DC | PRN
  Filled 2020-03-20: qty 10

## 2020-03-20 MED ORDER — ONDANSETRON HCL 4 MG/2ML IJ SOLN: 4.0000 mg | Freq: Four times a day (QID) | INTRAMUSCULAR | Status: DC | PRN

## 2020-03-20 MED ORDER — MISOPROSTOL 50MCG HALF TABLET
50.0000 ug | ORAL_TABLET | ORAL | Status: DC
  Administered 2020-03-20: 50 ug via BUCCAL

## 2020-03-20 MED ORDER — OXYCODONE-ACETAMINOPHEN 5-325 MG PO TABS: 1.0000 | ORAL_TABLET | ORAL | Status: DC | PRN

## 2020-03-20 MED ORDER — OXYTOCIN BOLUS FROM INFUSION
333.0000 mL | Freq: Once | INTRAVENOUS | Status: AC
  Administered 2020-03-21: 333 mL via INTRAVENOUS

## 2020-03-20 MED ORDER — MISOPROSTOL 50MCG HALF TABLET
ORAL_TABLET | ORAL | Status: AC
  Filled 2020-03-20: qty 1

## 2020-03-20 MED ORDER — MISOPROSTOL 100 MCG PO TABS
25.0000 ug | ORAL_TABLET | ORAL | Status: DC
  Administered 2020-03-20: 25 ug via VAGINAL
  Filled 2020-03-20: qty 1

## 2020-03-20 MED ORDER — OXYTOCIN-SODIUM CHLORIDE 30-0.9 UT/500ML-% IV SOLN
2.5000 [IU]/h | INTRAVENOUS | Status: DC
  Administered 2020-03-21: 2.5 [IU]/h via INTRAVENOUS

## 2020-03-20 MED ORDER — LACTATED RINGERS IV SOLN: 500.0000 mL | INTRAVENOUS | Status: DC | PRN

## 2020-03-20 MED ORDER — OXYTOCIN-SODIUM CHLORIDE 30-0.9 UT/500ML-% IV SOLN
1.0000 m[IU]/min | INTRAVENOUS | Status: DC
  Administered 2020-03-20: 2 m[IU]/min via INTRAVENOUS
  Filled 2020-03-20: qty 500

## 2020-03-20 MED ORDER — OXYTOCIN-SODIUM CHLORIDE 30-0.9 UT/500ML-% IV SOLN: 2.5000 [IU]/h | INTRAVENOUS | Status: DC

## 2020-03-20 MED ORDER — SOD CITRATE-CITRIC ACID 500-334 MG/5ML PO SOLN: 30.0000 mL | ORAL | Status: DC | PRN

## 2020-03-20 MED ORDER — EPHEDRINE 5 MG/ML INJ: 10.0000 mg | INTRAVENOUS | Status: DC | PRN

## 2020-03-20 MED ORDER — LACTATED RINGERS IV SOLN: INTRAVENOUS | Status: DC

## 2020-03-20 MED ORDER — MISOPROSTOL 25 MCG QUARTER TABLET: 25.0000 ug | ORAL_TABLET | ORAL | Status: DC

## 2020-03-20 MED ORDER — PHENYLEPHRINE 40 MCG/ML (10ML) SYRINGE FOR IV PUSH (FOR BLOOD PRESSURE SUPPORT): 80.0000 ug | PREFILLED_SYRINGE | INTRAVENOUS | Status: DC | PRN

## 2020-03-20 MED ORDER — LACTATED RINGERS IV SOLN: 500.0000 mL | Freq: Once | INTRAVENOUS | Status: DC

## 2020-03-20 NOTE — Progress Notes (Signed)
Vtx confirmed by [facility].  

## 2020-03-20 NOTE — Progress Notes (Addendum)
Sara Bradford is a 24 y.o. G1P0 at [redacted]w[redacted]d by LMP admitted for induction of labor due to Gestational diabetes.  Subjective: Pt resting in bed watching TV with FOB.  Objective: BP (!) 141/96   Pulse 92   Temp 98 F (36.7 C) (Oral)   Resp 16   Ht 5\' 8"  (1.727 m)   Wt 183 lb 3.2 oz (83.1 kg)   LMP 07/05/2019   BMI 27.86 kg/m  No intake/output data recorded. No intake/output data recorded.  FHT:  FHR: 130 bpm, variability: moderate,  accelerations:  Present,  decelerations:  Absent UC:   Cramping, but no regular contractions yet  First SVE: 1, 80, -1 Attemped FB placement, cervix too thin, asked 13/12/2018, CNM to attempt. As she got the FB placed, the cervix became 3-4cm.   SVE:   Dilation: 3 Effacement (%): 90 Station: -1 Exam by:: Sara Bradford, cnm  Labs: Lab Results  Component Value Date   WBC 7.6 03/20/2020   HGB 12.5 03/20/2020   HCT 38.5 03/20/2020   MCV 92.1 03/20/2020   PLT 211 03/20/2020    Assessment / Plan: IOL for gestational hypertension, progressing well with cytotec  Labor: Progressing normally Preeclampsia:  labs stable Fetal Wellbeing:  Category I Pain Control:  Labor support without medications I/D:  n/a Anticipated MOD:  NSVD  03/22/2020, CNM, MSN, [facility] 03/20/20 3:53 PM

## 2020-03-20 NOTE — H&P (Signed)
Sara Bradford is a 24 y.o. female presenting for IOL at 37wks for new onset gestational hypertension (diagnosed 03/18/20). OB History    Gravida  1   Para      Term      Preterm      AB      Living        SAB      TAB      Ectopic      Multiple      Live Births             History reviewed. No pertinent past medical history. History reviewed. No pertinent surgical history. Family History: family history includes Hypertension in her mother. Social History:  reports that she has never smoked. She has never used smokeless tobacco. She reports previous alcohol use. She reports previous drug use. Drug: Marijuana.     Maternal Diabetes: No Genetic Screening: Normal Maternal Ultrasounds/Referrals: Normal Fetal Ultrasounds or other Referrals:  None Maternal Substance Abuse:  No Significant Maternal Medications:  None Significant Maternal Lab Results:  Other: GBS pending Other Comments:  None  Review of Systems  Eyes: Negative for photophobia and visual disturbance.  Gastrointestinal: Negative for abdominal pain.  Musculoskeletal: Negative for back pain.  Neurological: Negative for dizziness and headaches.  All other systems reviewed and are negative.  Maternal Medical History:  Reason for admission: IOL for gestational hypertension  Fetal activity: Perceived fetal activity is normal.   Last perceived fetal movement was within the past hour.      Dilation: Closed Effacement (%): Thick Station: -3 Exam by:: j. Alejandra Barna,cnm Blood pressure 123/80, pulse 94, temperature 98 F (36.7 C), temperature source Oral, resp. rate 16, height 5\' 8"  (1.727 m), weight 183 lb 3.2 oz (83.1 kg), last menstrual period 07/05/2019. Maternal Exam:  Uterine Assessment: No contractions  Abdomen: Patient reports no abdominal tenderness. Introitus: Normal vulva. Normal vagina.  Pelvis: adequate for delivery.   Cervix: Cervix evaluated by digital exam.     Fetal Exam Fetal Monitor  Review: Mode: ultrasound.   Variability: moderate (6-25 bpm).   Pattern: accelerations present.    Fetal State Assessment: Category I - tracings are normal.     Physical Exam Vitals and nursing note reviewed. Exam conducted with a chaperone present.  Constitutional:      Appearance: Normal appearance. She is normal weight.  HENT:     Head: Normocephalic.     Mouth/Throat:     Mouth: Mucous membranes are moist.  Eyes:     Pupils: Pupils are equal, round, and reactive to light.  Cardiovascular:     Rate and Rhythm: Normal rate and regular rhythm.     Pulses: Normal pulses.     Heart sounds: Normal heart sounds.  Pulmonary:     Effort: Pulmonary effort is normal.     Breath sounds: Normal breath sounds.  Abdominal:     General: Bowel sounds are normal.  Genitourinary:    General: Normal vulva.  Skin:    General: Skin is warm and dry.     Capillary Refill: Capillary refill takes less than 2 seconds.  Neurological:     Mental Status: She is alert and oriented to person, place, and time.  Psychiatric:        Mood and Affect: Mood normal.        Behavior: Behavior normal.        Thought Content: Thought content normal.        Judgment: Judgment  normal.     Prenatal labs: ABO, Rh: --/--/O POS (07/22 1004) Antibody: NEG (07/22 1004) Rubella: 1.17 (01/20 1231) RPR: Non Reactive (05/20 3790)  HBsAg: Negative (01/20 1231)  HIV: Non Reactive (05/20 0938)  GBS:   pending  Assessment/Plan: [redacted] weeks gestation here for IOL for gestational hypertension Reactive NST Begin IOL with buccal cytotec, progress to FB, then Pitocin and AROM as indicated   Edd Arbour, CNM, MSN, [facility] 03/20/20 11:14 AM

## 2020-03-20 NOTE — Progress Notes (Signed)
LABOR PROGRESS NOTE  Sara Bradford is a 24 y.o. G1P0 at [redacted]w[redacted]d admitted for IOL 2/2 gHTN.  Subjective: Doing well. Comfortable. Having a little bit more cramping with starting Pitocin. Family at bedside.  Objective: BP 125/83   Pulse 81   Temp 98.3 F (36.8 C) (Oral)   Resp 18   Ht 5\' 8"  (1.727 m)   Wt 83.1 kg   LMP 07/05/2019   BMI 27.86 kg/m  or  Vitals:   03/20/20 1652 03/20/20 1903 03/20/20 1920 03/20/20 1921  BP: 124/78 127/74 125/83   Pulse: 93 85 81   Resp:   18   Temp:    98.3 F (36.8 C)  TempSrc:    Oral  Weight:      Height:        Dilation: 4 Effacement (%): 90 Cervical Position: Posterior Station: -1 Presentation: Vertex Exam by:: J Walker CNM  FHT: Baseline rate 135, moderate varibility, + accels, - decels Toco: Irregular; uterine irritability  Labs: Lab Results  Component Value Date   WBC 7.6 03/20/2020   HGB 12.5 03/20/2020   HCT 38.5 03/20/2020   MCV 92.1 03/20/2020   PLT 211 03/20/2020    Patient Active Problem List   Diagnosis Date Noted  . Indication for care in labor and delivery, antepartum 03/20/2020  . Gestational hypertension 03/18/2020  . Supervision of low-risk pregnancy 09/05/2019    Assessment / Plan: 24 y.o. G1P0 at [redacted]w[redacted]d here for IOL 2/2 gHTN.  Labor: Progressing well s/p Cytotec x2. Last SVE 4/90/-1. Started on Pitocin at 79. Will reassess in 3-4 hours after Pitocin initiation. AROM when favorable. gHTN: BP stable. Will continue to monitor. Fetal Wellbeing:  Cat 1 tracing Pain Control:  Planning for epidural Anticipated MOD:  SVD  40, MD 03/20/2020, 9:36 PM

## 2020-03-21 ENCOUNTER — Encounter (HOSPITAL_COMMUNITY): Payer: Self-pay | Admitting: Obstetrics and Gynecology

## 2020-03-21 ENCOUNTER — Inpatient Hospital Stay (HOSPITAL_COMMUNITY): Payer: Medicaid Other | Admitting: Anesthesiology

## 2020-03-21 DIAGNOSIS — O9902 Anemia complicating childbirth: Secondary | ICD-10-CM | POA: Diagnosis not present

## 2020-03-21 DIAGNOSIS — O134 Gestational [pregnancy-induced] hypertension without significant proteinuria, complicating childbirth: Principal | ICD-10-CM

## 2020-03-21 DIAGNOSIS — Z3A37 37 weeks gestation of pregnancy: Secondary | ICD-10-CM

## 2020-03-21 DIAGNOSIS — D649 Anemia, unspecified: Secondary | ICD-10-CM | POA: Diagnosis not present

## 2020-03-21 LAB — CBC
HCT: 34.9 % — ABNORMAL LOW (ref 36.0–46.0)
Hemoglobin: 11.8 g/dL — ABNORMAL LOW (ref 12.0–15.0)
MCH: 31.2 pg (ref 26.0–34.0)
MCHC: 33.8 g/dL (ref 30.0–36.0)
MCV: 92.3 fL (ref 80.0–100.0)
Platelets: 181 10*3/uL (ref 150–400)
RBC: 3.78 MIL/uL — ABNORMAL LOW (ref 3.87–5.11)
RDW: 13.4 % (ref 11.5–15.5)
WBC: 16.5 10*3/uL — ABNORMAL HIGH (ref 4.0–10.5)
nRBC: 0 % (ref 0.0–0.2)

## 2020-03-21 MED ORDER — SENNOSIDES-DOCUSATE SODIUM 8.6-50 MG PO TABS
2.0000 | ORAL_TABLET | ORAL | Status: DC
  Administered 2020-03-21 – 2020-03-22 (×2): 2 via ORAL
  Filled 2020-03-21 (×2): qty 2

## 2020-03-21 MED ORDER — PRENATAL MULTIVITAMIN CH
1.0000 | ORAL_TABLET | Freq: Every day | ORAL | Status: DC
  Administered 2020-03-21 – 2020-03-23 (×3): 1 via ORAL
  Filled 2020-03-21 (×2): qty 1

## 2020-03-21 MED ORDER — DIPHENHYDRAMINE HCL 25 MG PO CAPS: 25.0000 mg | ORAL_CAPSULE | Freq: Four times a day (QID) | ORAL | Status: DC | PRN

## 2020-03-21 MED ORDER — ACETAMINOPHEN 325 MG PO TABS
650.0000 mg | ORAL_TABLET | Freq: Four times a day (QID) | ORAL | Status: DC | PRN
  Administered 2020-03-22 – 2020-03-23 (×2): 650 mg via ORAL
  Filled 2020-03-21 (×2): qty 2

## 2020-03-21 MED ORDER — WITCH HAZEL-GLYCERIN EX PADS: 1.0000 "application " | MEDICATED_PAD | CUTANEOUS | Status: DC | PRN

## 2020-03-21 MED ORDER — ONDANSETRON HCL 4 MG/2ML IJ SOLN: 4.0000 mg | INTRAMUSCULAR | Status: DC | PRN

## 2020-03-21 MED ORDER — SIMETHICONE 80 MG PO CHEW: 80.0000 mg | CHEWABLE_TABLET | ORAL | Status: DC | PRN

## 2020-03-21 MED ORDER — MEASLES, MUMPS & RUBELLA VAC IJ SOLR: 0.5000 mL | Freq: Once | INTRAMUSCULAR | Status: DC

## 2020-03-21 MED ORDER — FENTANYL CITRATE (PF) 100 MCG/2ML IJ SOLN
100.0000 ug | Freq: Once | INTRAMUSCULAR | Status: AC
  Administered 2020-03-21: 100 ug via INTRAVENOUS

## 2020-03-21 MED ORDER — IBUPROFEN 600 MG PO TABS
600.0000 mg | ORAL_TABLET | Freq: Three times a day (TID) | ORAL | Status: DC | PRN
  Administered 2020-03-21 – 2020-03-22 (×4): 600 mg via ORAL
  Filled 2020-03-21 (×4): qty 1

## 2020-03-21 MED ORDER — FENTANYL CITRATE (PF) 100 MCG/2ML IJ SOLN
INTRAMUSCULAR | Status: AC
  Filled 2020-03-21: qty 2

## 2020-03-21 MED ORDER — BENZOCAINE-MENTHOL 20-0.5 % EX AERO
1.0000 "application " | INHALATION_SPRAY | CUTANEOUS | Status: DC | PRN
  Administered 2020-03-22: 1 via TOPICAL
  Filled 2020-03-21: qty 56

## 2020-03-21 MED ORDER — COCONUT OIL OIL: 1.0000 "application " | TOPICAL_OIL | Status: DC | PRN

## 2020-03-21 MED ORDER — DIBUCAINE (PERIANAL) 1 % EX OINT: 1.0000 "application " | TOPICAL_OINTMENT | CUTANEOUS | Status: DC | PRN

## 2020-03-21 MED ORDER — TETANUS-DIPHTH-ACELL PERTUSSIS 5-2.5-18.5 LF-MCG/0.5 IM SUSP: 0.5000 mL | Freq: Once | INTRAMUSCULAR | Status: DC

## 2020-03-21 MED ORDER — ONDANSETRON HCL 4 MG PO TABS: 4.0000 mg | ORAL_TABLET | ORAL | Status: DC | PRN

## 2020-03-21 MED ORDER — LIDOCAINE HCL (PF) 1 % IJ SOLN
INTRAMUSCULAR | Status: DC | PRN
  Administered 2020-03-21 (×2): 6 mL via EPIDURAL

## 2020-03-21 MED ORDER — SODIUM CHLORIDE (PF) 0.9 % IJ SOLN
INTRAMUSCULAR | Status: DC | PRN
  Administered 2020-03-21: 12 mL/h via EPIDURAL

## 2020-03-21 NOTE — Anesthesia Procedure Notes (Signed)
Epidural Patient location during procedure: OB Start time: 03/21/2020 2:15 AM End time: 03/21/2020 2:18 AM  Staffing Anesthesiologist: Leilani Able, MD Performed: anesthesiologist   Preanesthetic Checklist Completed: patient identified, IV checked, site marked, risks and benefits discussed, surgical consent, monitors and equipment checked, pre-op evaluation and timeout performed  Epidural Patient position: sitting Prep: DuraPrep and site prepped and draped Patient monitoring: continuous pulse ox and blood pressure Approach: midline Location: L3-L4 Injection technique: LOR air  Needle:  Needle type: Tuohy  Needle gauge: 17 G Needle length: 9 cm and 9 Needle insertion depth: 6 cm Catheter type: closed end flexible Catheter size: 19 Gauge Catheter at skin depth: 11 cm Test dose: negative and Other  Assessment Events: blood not aspirated, injection not painful, no injection resistance, no paresthesia and negative IV test  Additional Notes Reason for block:procedure for pain

## 2020-03-21 NOTE — Progress Notes (Signed)
LABOR PROGRESS NOTE  Sara Bradford is a 24 y.o. G1P0 at [redacted]w[redacted]d admitted for IOL 2/2 gHTN.  Subjective: Doing well. Feels like her contractions are closer together. FOB at bedside.  Objective: BP 115/69   Pulse 89   Temp 99.4 F (37.4 C) (Oral)   Resp 16   Ht 5\' 8"  (1.727 m)   Wt 83.1 kg   LMP 07/05/2019   BMI 27.86 kg/m  or  Vitals:   03/20/20 2330 03/20/20 2332 03/21/20 0001 03/21/20 0030  BP: (!) 133/86  122/69 115/69  Pulse: 98  87 89  Resp: 18  18 16   Temp:  99.4 F (37.4 C)    TempSrc:  Oral    Weight:      Height:        Dilation: 5 Effacement (%): 80 Cervical Position: Posterior Station: -2 Presentation: Vertex Exam by::  FHT: Baseline rate 130, moderate varibility, + accels, early decels Toco: Contractions every 3-5 minutes  Labs: Lab Results  Component Value Date   WBC 10.8 (H) 03/20/2020   HGB 11.6 (L) 03/20/2020   HCT 35.7 (L) 03/20/2020   MCV 92.7 03/20/2020   PLT 192 03/20/2020    Patient Active Problem List   Diagnosis Date Noted  . Indication for care in labor and delivery, antepartum 03/20/2020  . Gestational hypertension 03/18/2020  . Supervision of low-risk pregnancy 09/05/2019    Assessment / Plan: 24 y.o. G1P0 at 102w1d here for IOL 2/2 gHTN.   Labor: Progressing well. SVE now 5/80/-2. AROM performed with small amount of clear fluid. Will continue augmentation with Pitocin and reassess in 3 hours. gHTN: BP still within normal range. No symptoms. Will continue to monitor. Fetal Wellbeing:  Cat 1 tracing Pain Control:  Planning for epidural Anticipated MOD:  NSVD  [redacted]w[redacted]d, MD Family Medicine PGY-3 03/21/2020, 1:18 AM

## 2020-03-21 NOTE — Anesthesia Postprocedure Evaluation (Signed)
Anesthesia Post Note  Patient: Sara Bradford  Procedure(s) Performed: AN AD HOC LABOR EPIDURAL     Patient location during evaluation: Mother Baby Anesthesia Type: Epidural Level of consciousness: awake and alert and oriented Pain management: satisfactory to patient Vital Signs Assessment: post-procedure vital signs reviewed and stable Respiratory status: respiratory function stable Cardiovascular status: stable Postop Assessment: no headache, no backache, epidural receding, patient able to bend at knees, no signs of nausea or vomiting, adequate PO intake and able to ambulate Anesthetic complications: no   No complications documented.  Last Vitals:  Vitals:   03/21/20 0830 03/21/20 1230  BP: (!) 124/91 (!) 119/87  Pulse: 105 105  Resp: 18 18  Temp: 37.2 C 37.3 C  SpO2: 100% 100%    Last Pain:  Vitals:   03/21/20 1230  TempSrc: Oral  PainSc: 0-No pain   Pain Goal:                   Sierah Lacewell

## 2020-03-21 NOTE — Discharge Summary (Addendum)
Postpartum Discharge Summary    Patient Name: Sara Bradford DOB: October 30, 1995 MRN: [id number]  Date of admission: 03/20/2020 Delivery date:03/21/2020  Delivering provider: Genia Del  Date of discharge: 03/23/2020  Admitting diagnosis: Indication for care in labor and delivery, antepartum [O75.9] Gestational hypertension [O13.9] Intrauterine pregnancy: [redacted]w[redacted]d    Secondary diagnosis:  Active Problems:   Gestational hypertension   Indication for care in labor and delivery, antepartum  Additional problems: None    Discharge diagnosis: Term Pregnancy Delivered and Gestational Hypertension                                              Post partum procedures:none Augmentation: AROM, Pitocin and Cytotec  Complications: None  Hospital course: Induction of Labor With Vaginal Delivery   24y.o. yo G1P0 at 335w1das admitted to the hospital 03/20/2020 for induction of labor.  Indication for induction: Gestational hypertension.  Patient had an uncomplicated labor course as follows: Initial SVE on admission was closed/thick/-3. Patient received Cytotec x2 with progression to 4/90/-1. Pitocin was started at 1957 and AROM was performed at 0111 with clear fluid. Patient continued to progress with Pitocin titration and was ultimately 10/100/+1 at 0439. Membrane Rupture Time/Date: 1:11 AM ,03/21/2020   Delivery Method:Vaginal, Spontaneous  Episiotomy: None  Lacerations:  1st degree;Perineal  Details of delivery can be found in separate delivery note. BP's monitored post-partum and remained well controlled. Patient had a routine postpartum course. Patient is discharged home 03/23/20.  Newborn Data: Birth date:03/21/2020  Birth time:4:59 AM  Gender:Female  Living status:Living  Apgars:8 ,9  Weight:2869 g   Magnesium Sulfate received: No BMZ received: No Rhophylac:N/A MMR:N/A T-DaP:Given prenatally Flu: No Transfusion:No  Physical exam  Vitals:   03/22/20 0330 03/22/20 1400 03/22/20  1927 03/23/20 0514  BP: 124/74 121/83 (!) 126/87 120/78  Pulse: 101 78 86 87  Resp: 18 19 16 15   Temp: 98.4 F (36.9 C) 98.3 F (36.8 C) 98.2 F (36.8 C) 98.4 F (36.9 C)  TempSrc: Oral  Oral Oral  SpO2: 97%  100% 100%  Weight:      Height:       General: alert, cooperative and no distress Lochia: appropriate Uterine Fundus: firm Incision: N/A DVT Evaluation: No evidence of DVT seen on physical exam. Negative Homan's sign. No cords or calf tenderness. No significant calf/ankle edema. Labs: Lab Results  Component Value Date   WBC 16.5 (H) 03/21/2020   HGB 11.8 (L) 03/21/2020   HCT 34.9 (L) 03/21/2020   MCV 92.3 03/21/2020   PLT 181 03/21/2020   CMP Latest Ref Rng & Units 03/20/2020  Glucose 70 - 99 mg/dL 77  BUN 6 - 20 mg/dL 6  Creatinine 0.44 - 1.00 mg/dL 0.64  Sodium 135 - 145 mmol/L 136  Potassium 3.5 - 5.1 mmol/L 3.7  Chloride 98 - 111 mmol/L 106  CO2 22 - 32 mmol/L 20(L)  Calcium 8.9 - 10.3 mg/dL 9.2  Total Protein 6.5 - 8.1 g/dL 6.3(L)  Total Bilirubin 0.3 - 1.2 mg/dL 0.6  Alkaline Phos 38 - 126 U/L 90  AST 15 - 41 U/L 22  ALT 0 - 44 U/L 11   Edinburgh Score: Edinburgh Postnatal Depression Scale Screening Tool 03/22/2020  I have been able to laugh and see the funny side of things. 0  I have looked forward with enjoyment  to things. 0  I have blamed myself unnecessarily when things went wrong. 0  I have been anxious or worried for no good reason. 0  I have felt scared or panicky for no good reason. 0  Things have been getting on top of me. 0  I have been so unhappy that I have had difficulty sleeping. 0  I have felt sad or miserable. 0  I have been so unhappy that I have been crying. 0  The thought of harming myself has occurred to me. 0  Edinburgh Postnatal Depression Scale Total 0     After visit meds:  Allergies as of 03/23/2020   No Known Allergies     Medication List    TAKE these medications   acetaminophen 325 MG tablet Commonly known as:  Tylenol Take 2 tablets (650 mg total) by mouth every 6 (six) hours as needed (for pain scale < 4).   amLODipine 5 MG tablet Commonly known as: NORVASC Take 1 tablet (5 mg total) by mouth daily.   Blood Pressure Kit Devi 1 Device by Does not apply route as needed.   ibuprofen 600 MG tablet Commonly known as: ADVIL Take 1 tablet (600 mg total) by mouth every 8 (eight) hours as needed for mild pain.   prenatal vitamin w/FE, FA 27-1 MG Tabs tablet Take 1 tablet by mouth daily at 12 noon.        Discharge home in stable condition Infant Feeding: Breast Infant Disposition:home with mother Discharge instruction: per After Visit Summary and Postpartum booklet. Activity: Advance as tolerated. Pelvic rest for 6 weeks.  Diet: routine diet Future Appointments: Future Appointments  Date Time Provider [place]  03/28/2020  9:00 AM [facility] NURSE [facility]  04/22/2020  2:15 PM Nugent, Gerrie Nordmann, NP [facility]   Follow up Visit:  [place] for [facility] Follow up.   Specialty: Obstetrics and Gynecology Contact information: [place] [id number] [phone]               Please schedule this patient for a In person postpartum visit in 4 weeks with the following provider: Any provider. Additional Postpartum F/U:BP check 1 week  Low risk pregnancy complicated by: HTN Delivery mode:  Vaginal, Spontaneous  Anticipated Birth Control:  POPs   03/23/2020 Carmelina Noun, MD  Attestation of Supervision of Student:  I confirm that I have verified the information documented in the resident student's note and that I have also personally reperformed the history, physical exam and all medical decision making activities.  I have verified that all services and findings are accurately documented in this student's note; and I agree with management and plan as outlined in the  documentation. I have also made any necessary editorial changes.  Patient stable for discharge; she wants COC but will start when her BP has stabilized PP. She does not want any other BC today, although progesterone-only options offered.   Starr [place], [place] for [employer], [place] [facility] 03/23/2020 11:40 AM

## 2020-03-21 NOTE — Anesthesia Preprocedure Evaluation (Signed)
Anesthesia Evaluation  Patient identified by MRN, date of birth, ID band Patient awake    Reviewed: Allergy & Precautions, H&P , NPO status , Patient's Chart, lab work & pertinent test results  Airway Mallampati: I  TM Distance: >3 FB Neck ROM: full    Dental no notable dental hx. (+) Teeth Intact   Pulmonary neg pulmonary ROS,    Pulmonary exam normal breath sounds clear to auscultation       Cardiovascular Normal cardiovascular exam Rhythm:regular Rate:Normal     Neuro/Psych negative neurological ROS  negative psych ROS   GI/Hepatic negative GI ROS, Neg liver ROS,   Endo/Other  negative endocrine ROS  Renal/GU negative Renal ROS     Musculoskeletal   Abdominal Normal abdominal exam  (+)   Peds  Hematology  (+) Blood dyscrasia, anemia ,   Anesthesia Other Findings   Reproductive/Obstetrics (+) Pregnancy                             Anesthesia Physical Anesthesia Plan  ASA: II  Anesthesia Plan: Epidural   Post-op Pain Management:    Induction:   PONV Risk Score and Plan:   Airway Management Planned:   Additional Equipment:   Intra-op Plan:   Post-operative Plan:   Informed Consent: I have reviewed the patients History and Physical, chart, labs and discussed the procedure including the risks, benefits and alternatives for the proposed anesthesia with the patient or authorized representative who has indicated his/her understanding and acceptance.       Plan Discussed with:   Anesthesia Plan Comments:         Anesthesia Quick Evaluation

## 2020-03-21 NOTE — Lactation Note (Signed)
This note was copied from a baby's chart. Lactation Consultation Note  Patient Name: Sara Bradford [id number] Date: 03/21/2020 Reason for consult: Initial assessment;Early term 37-38.6wks;Primapara;1st time breastfeeding  P1 mother whose infant is now 67 hours old.  This is an ETI at 37+1 weeks.  Baby was STS on mother's chest when I arrived.  Mother had no immediate questions/concerns related to breast feeding.  Reviewed breast feeding basics including, STS, feeding cues, hand expression, tummy size and how to obtain a deep latch.  Mother was familiar with hand expression and has been able to express colostrum drops.  Container provided and milk storage times reviewed.  Finger feeding demonstrated.  Encouraged to feed 8-12 times/24 hours or sooner if baby shows cues.  Suggested mother call for latch assistance as needed.    Mom made aware of O/P services, breastfeeding support groups, community resources, and our phone # for post-discharge questions. Mother has a DEBP for home use.  Father present.   Maternal Data Formula Feeding for Exclusion: No Has patient been taught Hand Expression?: Yes Does the patient have breastfeeding experience prior to this delivery?: No  Feeding    LATCH Score                   Interventions    Lactation Tools Discussed/Used     Consult Status Consult Status: Follow-up Date: 03/22/20 Follow-up type: In-patient    [name] 03/21/2020, 11:52 AM

## 2020-03-22 LAB — CULTURE, BETA STREP (GROUP B ONLY): Strep Gp B Culture: NEGATIVE

## 2020-03-22 MED ORDER — AMLODIPINE BESYLATE 5 MG PO TABS
5.0000 mg | ORAL_TABLET | Freq: Every day | ORAL | Status: DC
  Administered 2020-03-22 – 2020-03-23 (×2): 5 mg via ORAL
  Filled 2020-03-22 (×2): qty 1

## 2020-03-22 NOTE — Lactation Note (Signed)
This note was copied from a baby's chart. Lactation Consultation Note  Patient Name: Sara Bradford [id number] Date: 03/22/2020  P1, 37 hour ETI female infant. Per mom, she feels BF is going well she doesn't have any questions or concerns for [facility] services. Infant has been BF most feeding for 15 to 20 minutes and she is only feel a tug when infant is latched at breast. LC did not observe a latch at this time infant BF at 16 30 for 15 minutes less than 2 hours before LC entered the room and infant is asleep in the basinet.  Mom knows to call RN or LC if she has questions, concerns or need assistance with latching infant at breast.    Maternal Data    Feeding    LATCH Score                   Interventions    Lactation Tools Discussed/Used     Consult Status      Danelle Earthly 03/22/2020, 6:09 PM

## 2020-03-22 NOTE — Progress Notes (Addendum)
POSTPARTUM PROGRESS NOTE  Post Partum Day 1  Subjective:  Sara Bradford is a 24 y.o. G1P1001 s/p NSVD at [redacted]w[redacted]d.  She reports she is doing well. No acute events overnight. She denies any problems with ambulating, voiding or PO intake. Pain is well controlled.  Lochia is appropriate.  Objective: Blood pressure 124/74, pulse 101, temperature 98.4 F (36.9 C), temperature source Oral, resp. rate 18, height 5\' 8"  (1.727 m), weight 83.1 kg, last menstrual period 07/05/2019, SpO2 97 %, unknown if currently breastfeeding.  Physical Exam:  General: Alert, cooperative and no distress Chest: No respiratory distress Heart: Regular rate, distal pulses intact Abdomen: Soft, nontender Uterine Fundus: Firm DVT Evaluation: No calf swelling or tenderness Extremities: No edema Skin: Warm, dry  Recent Labs    03/20/20 2157 03/21/20 0751  HGB 11.6* 11.8*  HCT 35.7* 34.9*    Assessment/Plan: Sara Bradford is a 24 y.o. G1P1001 s/p NSVD at [redacted]w[redacted]d   PPD#1 - Doing well. Continue routine postpartum care.  1. gHTN: Hypertensive during labor. Continues to have mildly elevated pressures postpartum. Asymptomatic. Will start Norvasc 5 mg daily. Will continue to monitor.  Contraception: POPs Feeding: Breastfeeding; doing well Dispo: Plan for discharge PPD#2   LOS: 2 days   [redacted]w[redacted]d, MD 03/22/2020, 7:55 AM   I saw and evaluated the patient. I agree with the findings and the plan of care as documented in the resident's note.  03/24/2020, MD [facility] Family Medicine Fellow, [facility] for [facility]

## 2020-03-23 MED ORDER — ACETAMINOPHEN 325 MG PO TABS: 650.0000 mg | ORAL_TABLET | Freq: Four times a day (QID) | ORAL | 0 refills | Status: AC | PRN

## 2020-03-23 MED ORDER — AMLODIPINE BESYLATE 5 MG PO TABS: 5.0000 mg | ORAL_TABLET | Freq: Every day | ORAL | 0 refills | Status: AC

## 2020-03-23 MED ORDER — IBUPROFEN 600 MG PO TABS: 600.0000 mg | ORAL_TABLET | Freq: Three times a day (TID) | ORAL | 0 refills | Status: AC | PRN

## 2020-03-23 NOTE — Discharge Instructions (Signed)

## 2020-03-23 NOTE — Lactation Note (Addendum)
This note was copied from a baby's chart. Lactation Consultation Note  Patient Name: Sara Bradford KDTOI'Z Date: 03/23/2020 Reason for consult: Follow-up assessment   This is an ETI , 56 hours old and is high risk for jaundice. Infant is 37+1 and is at 7.8 % weight loss this am.  Mother breastfeeding infant in cradle hold.,infant poorly latched with few tugs on the breast. Mother very receptive to assistance in correcting the latch.  Assist mother with waking infant several times and switch nursing . Infant has poor feeding behavior. Only a few sucks and a few swallows observed.   Discussed the use of donor milk with parents and parents receptive.   Assist mother with hand expression and obtained 2 ml of ebm that was given with a spoon.  Mother was sat up with a DEBP and assist with pumping. Mother was fit with # 27 flanges. Mother obtained 1-2 ml of ebm that she put on her nipple.  Mother very receptive to all teaching about cleaning pump parts.   Infant was placed under single photo therapy blanket .  Mother plans to offer feedings with donor milk . Mother was given LPI  Guidelines to use for supplementation. Reviewed guidelines with mother . Discussed different ways to offer supplement with mother. Cup, bottle or #5 feeding tube at the breast.  Mother reports that infant is feeding well.  Mother reports that she has an electric pump at home. She is unsure of the brand.  Mother reports that she has not opened the pump yet. Mother is not active with WIC.    Plan of Care : Breastfeed infant with feeding cues Supplement infant with ebm/donor milk  according to supplemental guidelines. Pump using a DEBP after each feeding for 15-20 mins.   Mother to continue to cue base feed infant and feed at least 8-12 times or more in 24 hours and advised to allow for cluster feeding infant as needed.   Mother to continue to due STS. Mother is aware of available LC services at [facility], BFSG'S, OP  Dept, and phone # for questions or concerns about breastfeeding.  Mother receptive to all teaching and plan of care.      Maternal Data    Feeding Feeding Type: Breast Milk  LATCH Score Latch: Grasps breast easily, tongue down, lips flanged, rhythmical sucking.  Audible Swallowing: A few with stimulation  Type of Nipple: Everted at rest and after stimulation  Comfort (Breast/Nipple): Soft / non-tender  Hold (Positioning): Assistance needed to correctly position infant at breast and maintain latch.  LATCH Score: 8  Interventions Interventions: Breast feeding basics reviewed;Assisted with latch;Skin to skin;Hand express;Breast compression;Adjust position;Support pillows;Position options;Hand pump;DEBP  Lactation Tools Discussed/Used WIC Program: No Pump Review: Setup, frequency, and cleaning;Milk Storage Initiated by:: Davit Vassar RN,IBCLC Date initiated:: 03/23/20   Consult Status Consult Status: Follow-up Date: 03/24/20 Follow-up type: In-patient    Stevan Born [facility] 03/23/2020, 10:36 AM

## 2020-03-24 ENCOUNTER — Ambulatory Visit: Payer: Self-pay

## 2020-03-24 ENCOUNTER — Telehealth: Payer: Self-pay | Admitting: *Deleted

## 2020-03-24 NOTE — Lactation Note (Signed)
This note was copied from a baby's chart. Lactation Consultation Note  Patient Name: Sara Bradford FSFSE'L Date: 03/24/2020 Reason for consult: Follow-up assessment   Infant is 43 hours old 65 weeker on bili lights. Infant given 8-10 ml of breast milk by the Mom.   LC assisted Mom with latching the infant in cross cradle on the left breast for about 10 minutes. Latch score of 8. Transferred infant to the Right breast. Infant noted for audible swallows which increased with breast compression. Infant left with Mom nursing skin to skin.   Mom's breasts are heavy but not engorged. Nipples are intact with no signs of trauma or redness. Instructed Mom that her milk is coming in and it is important to offer both breasts when infant is cueing. To prevent engorgement, she will need to pump afterwards.   Reviewed with the parents the importance of regular feedings for the infant since the high bilirubin can make him tired. 8-12 feedings every 24 hours, offering the breast first and pumping afterwards. Education on engorgement prevention and milk storage also reviewed.   Upon discharge, Mom has been given information on lactation services and the warm line.        Consult Status Consult Status: Complete Date: 03/24/20 Follow-up type: In-patient    Sara Koren  Bradford 03/24/2020, 11:24 AM

## 2020-03-24 NOTE — Lactation Note (Signed)
This note was copied from a baby's chart. Lactation Consultation Note  Patient Name: Sara Bradford MCNOB'S Date: 03/24/2020   Baby 76 hours old with phototherapy [redacted]w[redacted]d.   Mother states she has been pumping but not been latching recently. Mother pumped 11 ml last pumping. Encouraged her to call for next feeding.     Maternal Data    Feeding Feeding Type: Breast Milk Nipple Type: Slow - flow  LATCH Score                   Interventions    Lactation Tools Discussed/Used     Consult Status      Hardie Pulley 03/24/2020, 9:43 AM

## 2020-03-24 NOTE — Telephone Encounter (Signed)
Attempted to contact pt to complete transition of care assessment; message states "subscriber not in service"; unable to leave message.

## 2020-03-24 NOTE — Telephone Encounter (Signed)
Medicaid Managed Care team Transition of Care Assessment outreach attempt #2 made today. Unable to reach patient. HIPPA compliant voice message left requesting a return call. The patient has also been enrolled in an automated discharge follow up call series and will receive two outreach attempts for transition of care assessment. Contact information has been left for the patient and the [facility] Managed Care team is available to provide assistance to the patient at any time. . Message left on pt's cellphone.  Burnard Bunting, RN, BSN, CCRN Patient Engagement Center [phone]

## 2020-03-24 NOTE — Telephone Encounter (Signed)
Medicaid Managed Care team Transition of Care Assessment Outreach attempt #2 to contact pt on home phone; message states the subscriber is not available; unable to leave message.  Burnard Bunting, RN, BSN, CCRN Patient Engagement Center [phone]

## 2020-03-24 NOTE — Telephone Encounter (Signed)
Call received from [employer] in response to [facility] general discharge transition call. Patient scheduled with Dr Laural Benes on 06/09/20 for a NP visit. She also states she does not have any questions or concerns .

## 2020-03-26 ENCOUNTER — Telehealth: Payer: Self-pay | Admitting: *Deleted

## 2020-03-26 ENCOUNTER — Encounter: Payer: Medicaid Other | Admitting: Obstetrics and Gynecology

## 2020-03-26 NOTE — Telephone Encounter (Signed)
Received VM message from [employer] [facility] RN. She stated that she had seen pt for BP check @ home yesterday. She reported BP was 134/84 and that pt is taking meds as directed as well as denied all sx of Pre-e. Lowella Bandy stated to call her @ [phone] if repeat BP check is needed. Per chart review, pt has nurse appt in office on 7/30 for BP check. Additional check of BP by [facility] nurse is not needed at this time.

## 2020-03-28 ENCOUNTER — Other Ambulatory Visit: Payer: Self-pay

## 2020-03-28 ENCOUNTER — Ambulatory Visit (INDEPENDENT_AMBULATORY_CARE_PROVIDER_SITE_OTHER): Payer: Medicaid Other | Admitting: *Deleted

## 2020-03-28 VITALS — BP 118/90 | HR 90 | Ht 68.0 in | Wt 166.3 lb

## 2020-03-28 DIAGNOSIS — Z0130 Encounter for examination of blood pressure without abnormal findings: Secondary | ICD-10-CM

## 2020-03-28 NOTE — Progress Notes (Signed)
Pt presents for BP check S/P vaginal delivery on 7/23. Medication reconciliation completed. She reports no H/A or visual disturbances. BP - 128/98, P- 88; re-check after 10 minutes was 118/90. Pt states she has BP cuff @ home which she was given on 7/28. She has not checked her BP since [facility] RN was @ her home on 7/28. Pt was advised to check her BP once daily. She should notify us immediately or go to [facility]U if she develops H/A, visual disturbances or BP >160/100. Pt vocied understanding of all information and instructions given. She has post partum appt scheduled on 8/24.

## 2020-03-28 NOTE — Progress Notes (Signed)
Chart reviewed for nurse visit. Agree with plan of care. Patient is on Norvasc and asymptomatic today.   Marny Lowenstein, PA-C 03/28/2020 4:35 PM

## 2020-04-02 ENCOUNTER — Encounter: Payer: Medicaid Other | Admitting: Nurse Practitioner

## 2020-04-02 NOTE — Progress Notes (Signed)
Chart reviewed for nurse visit. Agree with plan of care. Patient is on Norvasc and asymptomatic today.   Marny Lowenstein, PA-C 04/02/2020 3:25 PM

## 2020-04-09 ENCOUNTER — Encounter: Payer: Medicaid Other | Admitting: Medical

## 2020-04-22 ENCOUNTER — Ambulatory Visit (INDEPENDENT_AMBULATORY_CARE_PROVIDER_SITE_OTHER): Payer: Medicaid Other | Admitting: Women's Health

## 2020-04-22 ENCOUNTER — Other Ambulatory Visit: Payer: Self-pay

## 2020-04-22 ENCOUNTER — Encounter: Payer: Self-pay | Admitting: Women's Health

## 2020-04-22 NOTE — Patient Instructions (Addendum)
Ethinyl Estradiol; Norethindrone Acetate; Ferrous fumarate tablets or capsules What is this medicine? ETHINYL ESTRADIOL; NORETHINDRONE ACETATE; FERROUS FUMARATE (ETH in il es tra DYE ole; nor eth IN drone AS e tate; FER us FUE ma rate) is an oral contraceptive. The products combine two types of female hormones, an estrogen and a progestin. They are used to prevent ovulation and pregnancy. Some products are also used to treat acne in females. This medicine may be used for other purposes; ask your health care provider or pharmacist if you have questions. COMMON BRAND NAME(S): Aurovela 24 Fe 1/20, Aurovela Fe, Blisovi 24 Fe, Blisovi Fe, Estrostep Fe, Gildess 24 Fe, Gildess Fe 1.5/30, Gildess Fe 1/20, Hailey 24 Fe, Hailey Fe 1.5/30, Junel Fe 1.5/30, Junel Fe 1/20, Junel Fe 24, Larin Fe, Lo Loestrin Fe, Loestrin 24 Fe, Loestrin FE 1.5/30, Loestrin FE 1/20, Lomedia 24 Fe, Microgestin 24 Fe, Microgestin Fe 1.5/30, Microgestin Fe 1/20, Tarina 24 Fe, Tarina Fe 1/20, Taytulla, Tilia Fe, Tri-Legest Fe What should I tell my health care provider before I take this medicine? They need to know if you have any of these conditions:  abnormal vaginal bleeding  blood vessel disease  breast, cervical, endometrial, ovarian, liver, or uterine cancer  diabetes  gallbladder disease  heart disease or recent heart attack  high blood pressure  high cholesterol  history of blood clots  kidney disease  liver disease  migraine headaches  smoke tobacco  stroke  systemic lupus erythematosus (SLE)  an unusual or allergic reaction to estrogens, progestins, other medicines, foods, dyes, or preservatives  pregnant or trying to get pregnant  breast-feeding How should I use this medicine? Take this medicine by mouth. To reduce nausea, this medicine may be taken with food. Follow the directions on the prescription label. Take this medicine at the same time each day and in the order directed on the package. Do  not take your medicine more often than directed. A patient package insert for the product will be given with each prescription and refill. Read this sheet carefully each time. The sheet may change frequently. Contact your pediatrician regarding the use of this medicine in children. Special care may be needed. This medicine has been used in female children who have started having menstrual periods. Overdosage: If you think you have taken too much of this medicine contact a poison control center or emergency room at once. NOTE: This medicine is only for you. Do not share this medicine with others. What if I miss a dose? If you miss a dose, refer to the patient information sheet you received with your medicine for direction. If you miss more than one pill, this medicine may not be as effective and you may need to use another form of birth control. What may interact with this medicine? Do not take this medicine with the following medication:  dasabuvir; ombitasvir; paritaprevir; ritonavir  ombitasvir; paritaprevir; ritonavir This medicine may also interact with the following medications:  acetaminophen  antibiotics or medicines for infections, especially rifampin, rifabutin, rifapentine, and griseofulvin, and possibly penicillins or tetracyclines  aprepitant  ascorbic acid (vitamin C)  atorvastatin  barbiturate medicines, such as phenobarbital  bosentan  carbamazepine  caffeine  clofibrate  cyclosporine  dantrolene  doxercalciferol  felbamate  grapefruit juice  hydrocortisone  medicines for anxiety or sleeping problems, such as diazepam or temazepam  medicines for diabetes, including pioglitazone  mineral oil  modafinil  mycophenolate  nefazodone  oxcarbazepine  phenytoin  prednisolone  ritonavir or other medicines for   HIV infection or AIDS  rosuvastatin  selegiline  soy isoflavones supplements  St. John's wort  tamoxifen or  raloxifene  theophylline  thyroid hormones  topiramate  warfarin This list may not describe all possible interactions. Give your health care provider a list of all the medicines, herbs, non-prescription drugs, or dietary supplements you use. Also tell them if you smoke, drink alcohol, or use illegal drugs. Some items may interact with your medicine. What should I watch for while using this medicine? Visit your doctor or health care professional for regular checks on your progress. You will need a regular breast and pelvic exam and Pap smear while on this medicine. Use an additional method of contraception during the first cycle that you take these tablets. If you have any reason to think you are pregnant, stop taking this medicine right away and contact your doctor or health care professional. If you are taking this medicine for hormone related problems, it may take several cycles of use to see improvement in your condition. Smoking increases the risk of getting a blood clot or having a stroke while you are taking birth control pills, especially if you are more than 24 years old. You are strongly advised not to smoke. This medicine can make your body retain fluid, making your fingers, hands, or ankles swell. Your blood pressure can go up. Contact your doctor or health care professional if you feel you are retaining fluid. This medicine can make you more sensitive to the sun. Keep out of the sun. If you cannot avoid being in the sun, wear protective clothing and use sunscreen. Do not use sun lamps or tanning beds/booths. If you wear contact lenses and notice visual changes, or if the lenses begin to feel uncomfortable, consult your eye care specialist. In some women, tenderness, swelling, or minor bleeding of the gums may occur. Notify your dentist if this happens. Brushing and flossing your teeth regularly may help limit this. See your dentist regularly and inform your dentist of the medicines you  are taking. If you are going to have elective surgery, you may need to stop taking this medicine before the surgery. Consult your health care professional for advice. This medicine does not protect you against HIV infection (AIDS) or any other sexually transmitted diseases. What side effects may I notice from receiving this medicine? Side effects that you should report to your doctor or health care professional as soon as possible:  allergic reactions like skin rash, itching or hives, swelling of the face, lips, or tongue  breast tissue changes or discharge  changes in vaginal bleeding during your period or between your periods  changes in vision  chest pain  confusion  coughing up blood  dizziness  feeling faint or lightheaded  headaches or migraines  leg, arm or groin pain  loss of balance or coordination  severe or sudden headaches  stomach pain (severe)  sudden shortness of breath  sudden numbness or weakness of the face, arm or leg  symptoms of vaginal infection like itching, irritation or unusual discharge  tenderness in the upper abdomen  trouble speaking or understanding  vomiting  yellowing of the eyes or skin Side effects that usually do not require medical attention (report to your doctor or health care professional if they continue or are bothersome):  breakthrough bleeding and spotting that continues beyond the 3 initial cycles of pills  breast tenderness  mood changes, anxiety, depression, frustration, anger, or emotional outbursts  increased sensitivity to sun  or ultraviolet light  nausea  skin rash, acne, or brown spots on the skin  weight gain (slight) This list may not describe all possible side effects. Call your doctor for medical advice about side effects. You may report side effects to FDA at 1-800-FDA-1088. Where should I keep my medicine? Keep out of the reach of children. Store at room temperature between 15 and 30 degrees C  (59 and 86 degrees F). Throw away any unused medicine after the expiration date. NOTE: This sheet is a summary. It may not cover all possible information. If you have questions about this medicine, talk to your doctor, pharmacist, or health care provider.  2020 Elsevier/Gold Standard (2016-04-26 08:04:41)       AREA FAMILY PRACTICE PHYSICIANS  [place] ([id number]) . [facility] o [address] [place] [place], [place] [address] o [phone] o Mon-Fri 8:30-12:30, 1:30-5:00 o Accepting Medicaid . [facility] [address], [place], [place] [address] o [phone] o Mon-Fri 8:00-5:30 . [employer] o [address], [place], [place] [address] o [phone], Tue, Thur, Fri 8:30-5:00, Wed 10:00-7:00 (closed 1-2pm) o Accepting Medicaid . [facility] o [address], [place], [place]  [address] o Phone - [phone]   Fax - [phone]  [place]  [phone]) . [facility] Medicine o [address] [place], [place] [address] o [phone] o Mon-Fri 8:00-5:00 . [facility] - Pediatrics at [facility])  o [address] [place] [place], [place] [address] o [phone] o Mon-Fri 8:30-5:30, Sat (Oct.-Mar.) 9:00-1:00 o Accepting [facility] [phone]) . [facility] at [facility] o [address] [place], [place] [address] o [phone] o Mon-Fri 8:00-5:00  [place] [phone]) . [facility] at [facility] o [address], [place], [place] [address] o [phone] o Mon-Fri 8:00-5:00 . Nature conservation officer at [place] o [address] [place], [place], [place] [address] o [phone] o Mon-Fri 8:00-5:00 . Nature conservation officer at [employer] o [address] [place], [place] [address] o [phone] o Mon-Fri 8:00-5:00 . [facility] o [address] [place] [address] o [phone] o Mon-Fri 7:30-5:30  [place] [phone] & [phone]) . [facility] o [address], [place], [place] [address] o [phone] o Mon-Thur 8:00-6:00 o Accepting Medicaid . [facility] o [address] [place], [place] [address] o [phone] o Mon-Thur 7:30-7:30, Fri 7:30-4:30 o Accepting Medicaid . [facility] at [facility] o [phone] [address] [place], [place]  [address] o [phone]   Fax - [phone]  [place]  [phone] & [phone]) . Nature conservation officer at [employer] o [address] [place], [place] [address] o [phone] o Mon-Fri 7:00-5:00 . [facility] Family Medicine o [address], [place], [place] [address] o [phone] o Mon-Fri 8:00-5:00 o Accepting Medicaid . [facility] - [facility] o [address], [place], [place] [address] o [phone] o Mon-Fri 8:00-5:00 o Accepting Medicaid  [facility] [place] [phone]) . [facility] Primary Care at [facility] o [address] [place], [place] [address] o [phone] o Mon-Fri 8:00-5:00 . [facility] - [facility] at [employer]) o [address], [place], [place] [address] o [phone] o Mon-Fri 8:00-5:00 o Accepting Medicaid . [facility] - [facility]  [facility]) o [phone] [address], [place], [place] [address] o [phone] o Mon-Fri 8:00-5:30, Sat&Sun by appointment (phones open at 8:30) o Accepting [facility] [phone] & [phone]) . [facility] Medicine o [address] [place], [place] [address] o [phone] o Mon-Thur 8:00-7:00, Fri 8:00-5:00, Sat 8:00-12:00, Sun 9:00-12:00 o Accepting Medicaid . [facility] - Family Medicine at [facility] [address], [place], [place] [address] o [phone] o Mon-Thur 8:00-5:00 o Accepting Medicaid . [facility] Medicine at [place] o [address] [place] [place], [place] [address] o [phone] o Mon-Fri 8:00-5:30, Sat (Oct.-Mar.) 9:00-1:00 o Accepting [employer] [phone]) . [facility] Medicine o [address] [place], [place] [address] o [phone] o Mon-Fri 8:00-5:00 o Accepting Medicaid   [facility] [phone]) . [facility] Family Medicine at [facility] o [address], [place], [place] [address] o [phone] o Mon-Fri 8:00-5:00 . Nature conservation officer at [facility] o [address], [place], [place] [address] o [phone] o Mon-Fri 8:00-5:00 . [facility] Pediatrics - [place] o [address], [place], [place] [address] o [phone] o Mon-Fri 8:00-5:00 o After hours clinic [facility] [place], [place] [address]) [phone] Mon-Fri 5:00-8:00, Sat 12:00-6:00, Sun 10:00-4:00 o Accepting Medicaid . [facility] Family Medicine at [facility] o [address] [place], [place]  [address] o [phone]   Fax - [phone]  [place] [phone]) . Nature conservation officer at [facility] o [address] [place], [place] [address] o [phone] o Mon-Fri 8:00-5:00 . [facility] Operating Room Services Family Medicine - [place] [facility] at [place]) o [address], [place], [place] [address] o [phone] o Mon-Thur 8:00-7:00, Fri 8:00-5:00, Sat 8:00-12:00

## 2020-04-22 NOTE — Progress Notes (Signed)
Post Partum Visit Note  Sara Bradford is a 24 y.o. G60P1001 female who presents for a postpartum visit. She is 4 weeks postpartum following a normal spontaneous vaginal delivery.  I have fully reviewed the prenatal and intrapartum course. The delivery was at 37.1 gestational weeks.  Anesthesia: epidural. Postpartum course has been unrematrkable. Baby is doing well. Baby is feeding by breast. Bleeding no bleeding. Bowel function is normal. Bladder function is normal. Patient is not sexually active. Contraception method is OCP (estrogen/progesterone). Postpartum depression screening: Negative.  The pregnancy intention screening data noted above was reviewed. Potential methods of contraception were discussed. Pt denies any medical history and reports NKDA. Pt is taking PNVs and amlodipine. Discussed administration, side effects, warning signs, time to effectiveness, back-up method PRN, protecting against STDs. The patient elected to proceed with Oral Contraceptive.    Edinburgh Postnatal Depression Scale - 04/22/20 1420      Edinburgh Postnatal Depression Scale:  In the Past 7 Days   I have been able to laugh and see the funny side of things. 0    I have looked forward with enjoyment to things. 0    I have blamed myself unnecessarily when things went wrong. 0    I have been anxious or worried for no good reason. 0    I have felt scared or panicky for no good reason. 0    Things have been getting on top of me. 0    I have been so unhappy that I have had difficulty sleeping. 0    I have felt sad or miserable. 0    I have been so unhappy that I have been crying. 0    The thought of harming myself has occurred to me. 0    Edinburgh Postnatal Depression Scale Total 0            The following portions of the patient's history were reviewed and updated as appropriate: allergies, current medications, past family history, past medical history, past social history, past surgical history and  problem list.  Review of Systems Pertinent items noted in HPI and remainder of comprehensive ROS otherwise negative.     Objective:  Blood pressure 110/84, pulse 96, height 5\' 7"  (1.702 m), weight 154 lb (69.9 kg), last menstrual period 07/05/2019, unknown if currently breastfeeding.  General:  alert, cooperative and no distress   Breasts:  not examined  Lungs: clear to auscultation bilaterally  Heart:  regular rate and rhythm, S1, S2 normal, no murmur, click, rub or gallop  Abdomen: soft, non-tender; bowel sounds normal; no masses,  no organomegaly   Vulva:  not evaluated  Vagina: not evaluated  Cervix:  not examined  Corpus: not examined  Adnexa:  not evaluated  Rectal Exam: Not performed.        Assessment:    Normal postpartum exam. Pap smear 09/19/2019. Plan:   Essential components of care per ACOG recommendations:  1.  Mood and well being: Patient with negative depression screening today. Reviewed local resources for support.  - Patient does not use tobacco. - hx of drug use? No   2. Infant care and feeding:  -Patient currently breastmilk feeding? Yes If breastmilk feeding discussed return to work and pumping. If needed, patient was provided letter for work to allow for every 2-3 hr pumping breaks, and to be granted a private location to express breastmilk and refrigerated area to store breastmilk. Reviewed importance of draining breast regularly to support lactation. -Social determinants  of health (SDOH) reviewed in EPIC. No concerns  3. Sexuality, contraception and birth spacing - Patient does not want a pregnancy in the next year.  Desired family size is 2 children.  - Reviewed forms of contraception in tiered fashion. Patient desired oral contraceptives (estrogen/progesterone) today.   - Discussed birth spacing of 18 months - pt still taking amlodipine daily, normal BP today, in consultation with Dr. Earlene Plater, pt advised to discontinue amlodipine, and will recheck BP on  Friday. Pending BP check, will continue with RX for OCPs or will discuss alternative methods and restart of BP medication with elevated BP.  4. Sleep and fatigue -Encouraged family/partner/community support of 4 hrs of uninterrupted sleep to help with mood and fatigue  5. Physical Recovery  - Discussed patients delivery and complications - Patient had a first degree laceration, perineal healing reviewed. Patient expressed understanding - Patient has urinary incontinence? No - Patient is safe to resume physical and sexual activity  6.  Health Maintenance - Last pap smear done 08/2019 and was normal.  7. Chronic Disease - PCP follow up  Marylen Ponto, NP [facility] [employer], [facility]

## 2020-04-25 ENCOUNTER — Other Ambulatory Visit: Payer: Self-pay

## 2020-04-25 ENCOUNTER — Ambulatory Visit (INDEPENDENT_AMBULATORY_CARE_PROVIDER_SITE_OTHER): Payer: Medicaid Other | Admitting: *Deleted

## 2020-04-25 ENCOUNTER — Encounter: Payer: Self-pay | Admitting: *Deleted

## 2020-04-25 VITALS — BP 129/85 | HR 80 | Ht 67.0 in | Wt 153.5 lb

## 2020-04-25 DIAGNOSIS — Z0130 Encounter for examination of blood pressure without abnormal findings: Secondary | ICD-10-CM

## 2020-04-25 DIAGNOSIS — Z30011 Encounter for initial prescription of contraceptive pills: Secondary | ICD-10-CM

## 2020-04-25 MED ORDER — NORETHINDRONE 0.35 MG PO TABS: 1.0000 | ORAL_TABLET | Freq: Every day | ORAL | 11 refills | Status: AC

## 2020-04-25 NOTE — Progress Notes (Signed)
Pt here for BP check. BP - 129/85, 80.  She states she stopped taking Amlodipine 3 days ago as directed. She denies H/A or visual disturbances. Pt is currently breastfeeding and would like to begin OCP's for birth control.  Rx obtained from Vonzella Nipple, [place] and was e-prescribed. Pt was advised to let us know when she stops breastfeeding and a different OCP will be prescribed. She voiced understanding.

## 2020-04-28 NOTE — Progress Notes (Signed)
Patient was assessed and managed by nursing staff during this encounter. I have reviewed the chart and agree with the documentation and plan.  Vonzella Nipple, PA-C 04/28/2020 10:15 AM

## 2020-05-08 ENCOUNTER — Encounter: Payer: Self-pay | Admitting: General Practice

## 2020-06-09 ENCOUNTER — Ambulatory Visit: Payer: Medicaid Other | Admitting: Internal Medicine

## 2020-11-30 IMAGING — US US MFM OB COMP +14 WKS
1 series · 13 of 28 positions shown · non-contrast
Comparison: none

[Series 1: us mfm ob comp +14 wks · 13 of 97 slices shown]
[im 4/97]
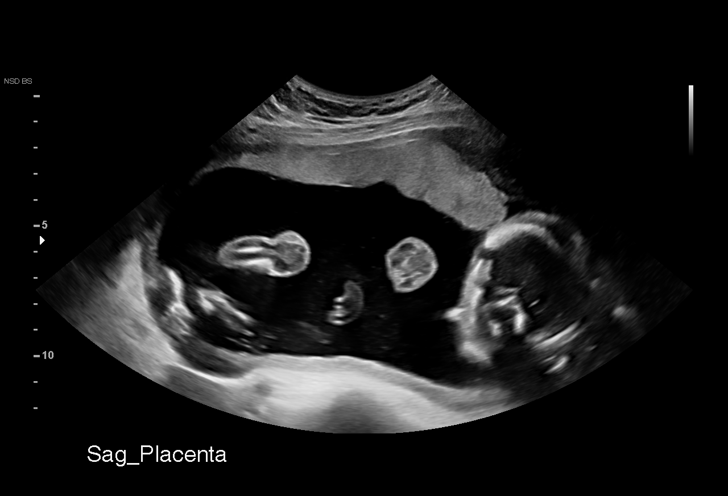
[im 11/97]
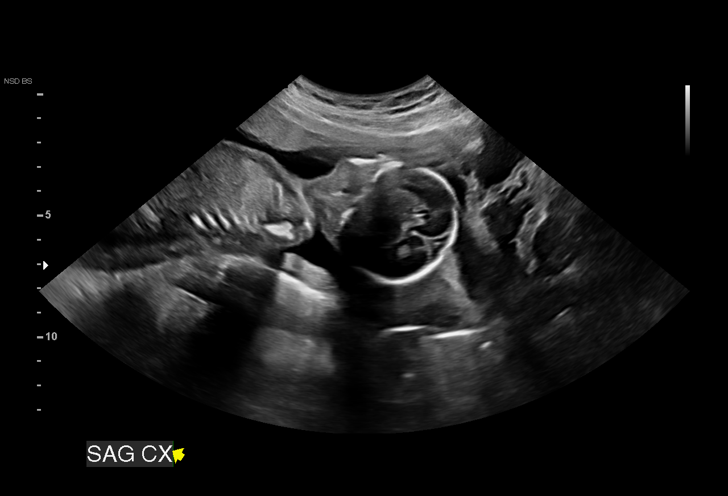
[im 18/97]
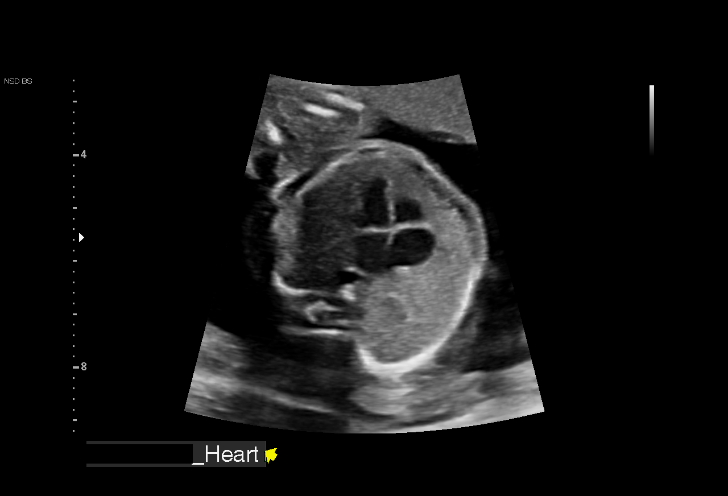
[im 25/97]
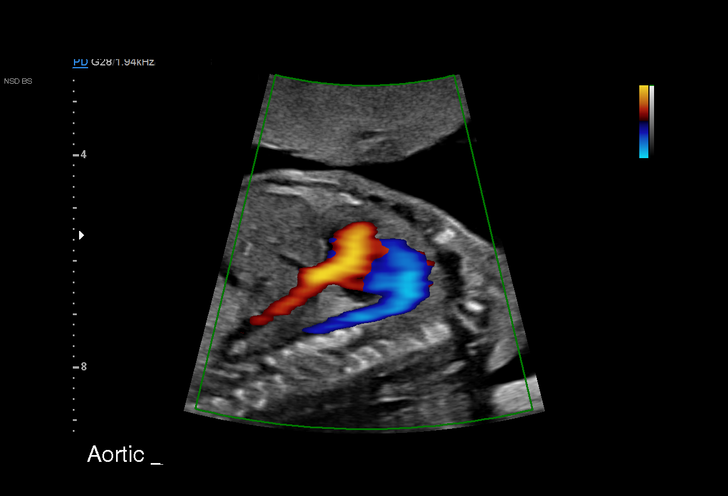
[im 33/97]
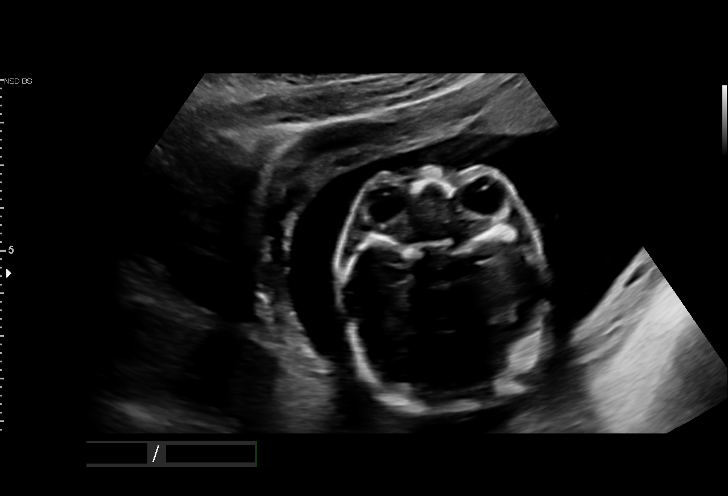
[im 40/97]
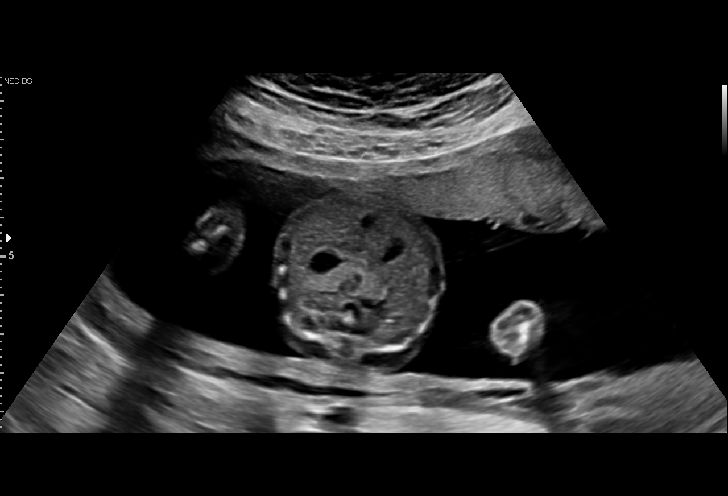
[im 50/97]
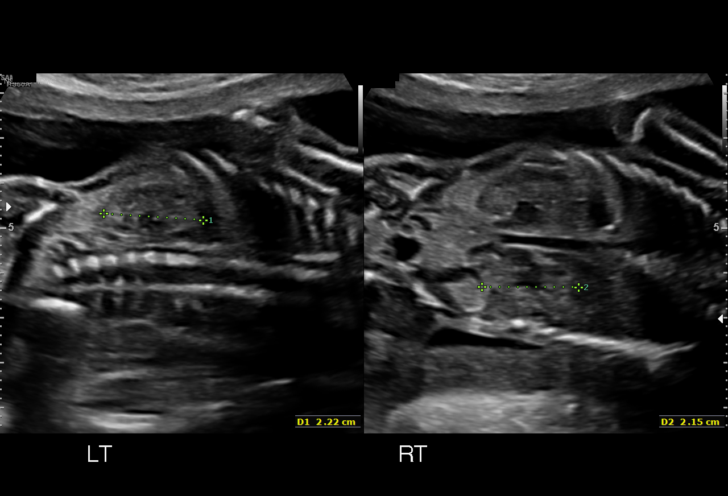
[im 57/97]
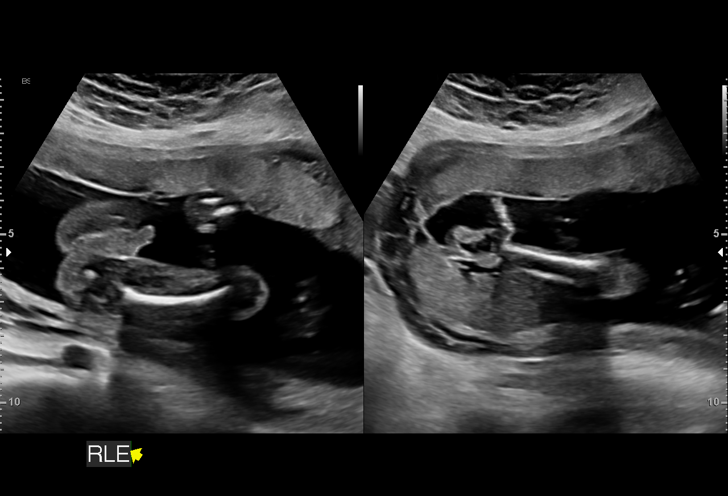
[im 65/97]
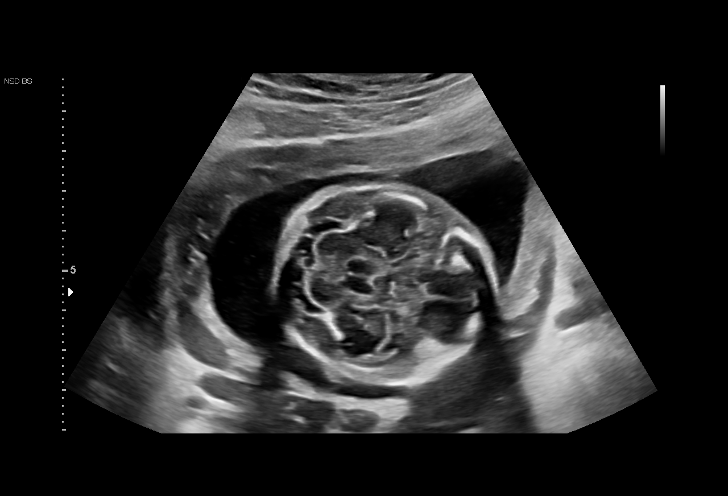
[im 72/97]
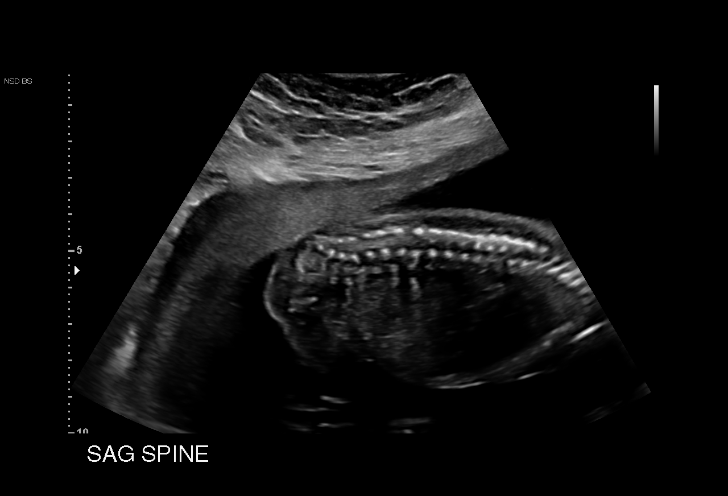
[im 79/97]
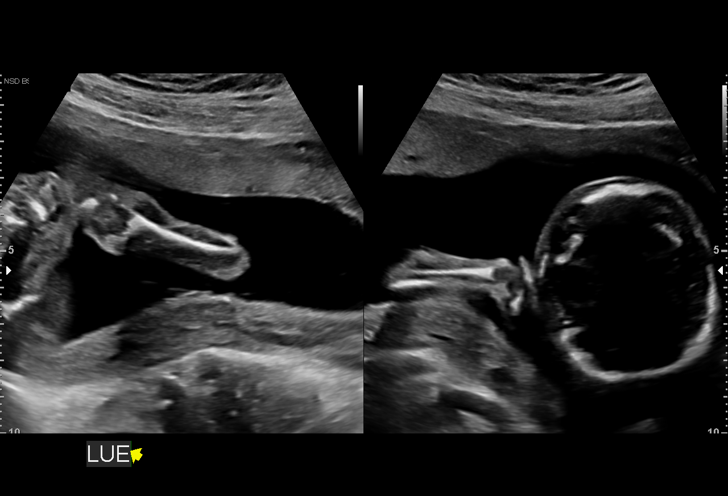
[im 86/97]
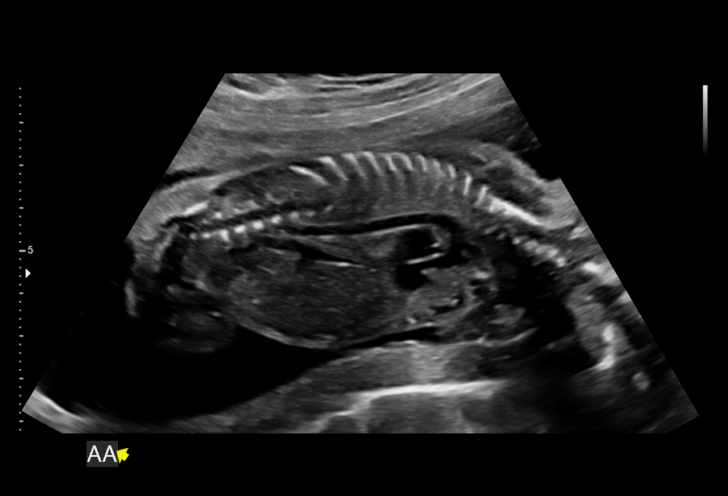
[im 93/97]
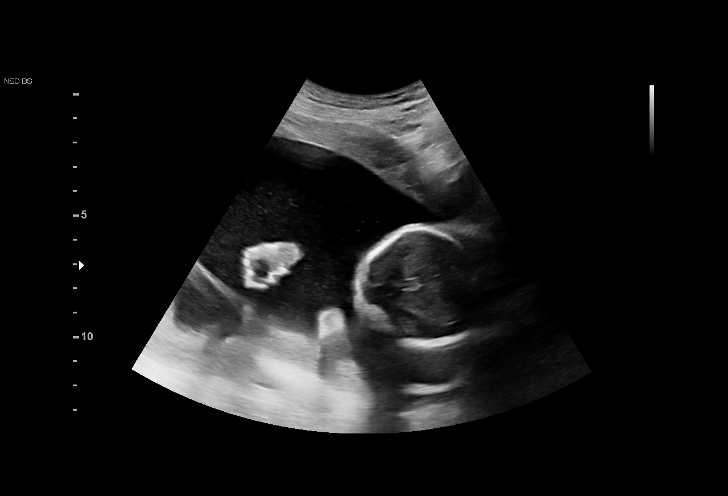

[13 of 28 positions shown; findings below may reference images not displayed]

1  US MFM OB COMP + 14 WK               76805.01     TONER CARTRIDGE REBACCA
 ----------------------------------------------------------------------

 ----------------------------------------------------------------------
Indications

  Encounter for antenatal screening for
  malformations (LR NIPS)
  19 weeks gestation of pregnancy
 ----------------------------------------------------------------------
Fetal Evaluation

 Num Of Fetuses:         1
 Fetal Heart Rate(bpm):  161
 Cardiac Activity:       Observed
 Presentation:           Cephalic
 Placenta:               Left lateral
 P. Cord Insertion:      Visualized

 Amniotic Fluid
 AFI FV:      Within normal limits

                             Largest Pocket(cm)

Biometry

 BPD:      47.7  mm     G. Age:  20w 3d         73  %    CI:        79.49   %    70 - 86
                                                         FL/HC:      20.3   %    16.8 -
 HC:      169.1  mm     G. Age:  19w 4d         28  %    HC/AC:      1.13        1.09 -
 AC:      149.3  mm     G. Age:  20w 1d         56  %    FL/BPD:     71.9   %
 FL:       34.3  mm     G. Age:  20w 6d         75  %    FL/AC:      23.0   %    20 - 24
 HUM:      31.5  mm     G. Age:  20w 4d         70  %
 CER:      20.1  mm     G. Age:  19w 1d         34  %
 NFT:       4.3  mm

 LV:        7.9  mm
 CM:        3.6  mm

 Est. FW:     350  gm    0 lb 12 oz      75  %
OB History

 Gravidity:    1         Term:   0        Prem:   0        SAB:   0
 TOP:          0       Ectopic:  0        Living: 0
Gestational Age

 LMP:           19w 6d        Date:  07/05/19                 EDD:   04/10/20
 U/S Today:     20w 2d                                        EDD:   04/07/20
 Best:          19w 6d     Det. By:  LMP  (07/05/19)          EDD:   04/10/20
Anatomy

 Cranium:               Appears normal         LVOT:                   Appears normal
 Cavum:                 Appears normal         Aortic Arch:            Appears normal
 Ventricles:            Appears normal         Ductal Arch:            Appears normal
 Choroid Plexus:        Appears normal         Diaphragm:              Appears normal
 Cerebellum:            Appears normal         Stomach:                Appears normal, left
                                                                       sided
 Posterior Fossa:       Appears normal         Abdomen:                Appears normal
 Nuchal Fold:           Appears normal         Abdominal Wall:         Appears nml (cord
                                                                       insert, abd wall)
 Face:                  Orbits and profile     Cord Vessels:           Appears normal (3
                        previously seen                                vessel cord)
 Lips:                  Appears normal         Kidneys:                Appear normal
 Palate:                Appears normal         Bladder:                Appears normal
 Thoracic:              Appears normal         Spine:                  Appears normal
 Heart:                 Appears normal         Upper Extremities:      Appears normal
                        (4CH, axis, and
                        situs)
 RVOT:                  Appears normal         Lower Extremities:      Appears normal

 Other:  Fetus appears to be a male. Heels and 5th digit visualized. Nasal
         bone visualized. Open hands visualized.
Cervix Uterus Adnexa

 Cervix
 Length:            2.8  cm.
 Normal appearance by transabdominal scan.

 Left Ovary
 Within normal limits.

 Right Ovary
 Not visualized.

 Adnexa
 No abnormality visualized.
Impression

 Normal anatomy with ultrasound consistent with dates.
 Good fetal movement and amniotic fluid
Recommendations

 Follow up as clinically indicated.
